# Patient Record
Sex: Male | Born: 2000
Health system: Southern US, Community
[De-identification: ages and names within clinical notes are randomized; demographics above are authoritative.]

---
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
## 2022-03-09 ENCOUNTER — Encounter: Payer: Self-pay | Admitting: Family Medicine

## 2022-03-09 ENCOUNTER — Ambulatory Visit: Payer: BC Managed Care – PPO | Admitting: Family Medicine

## 2022-03-09 VITALS — BP 130/80 | HR 80 | Temp 99.0°F | Ht 67.0 in | Wt 162.2 lb

## 2022-03-09 DIAGNOSIS — H6690 Otitis media, unspecified, unspecified ear: Secondary | ICD-10-CM

## 2022-03-09 MED ORDER — AMOXICILLIN 875 MG PO TABS: 875.0000 mg | ORAL_TABLET | Freq: Two times a day (BID) | ORAL | 0 refills | Status: DC

## 2022-03-09 NOTE — Patient Instructions (Signed)
  Take the antibiotic twice daily for 10 days to treat an ear infection. Allergies or a cold could be contributing to why your ears were hurting and got filled with fluid (that subsequently got infected).  You may take tylenol and/or ibuprofen if needed for fever or pain. Use Sudafed if needed for any ear discomfort, congestion and runny nose.  You may also take a claritin or allegra or zyrtec if you feel like you have seasonal allergies starting (itchy/watery eyes, sneezing, runny nose).  Return for recheck if you have persistent or worsening symptoms.  If you feel much worse tomorrow (high fever, body aches, etc), then you may want to do a home COVID test.

## 2022-03-09 NOTE — Progress Notes (Signed)
Chief Complaint  Patient presents with   Ear Pain    Ear pain x 1 week. Started with L ear about a week ago, R ear began bothering him Sunday or Monday this week.    Started with L ear pain 1-2 weeks ago, and is now in both ears. R started hurting Sun/Mon of this week. Feeling worse overall today.  He has pain on both sides when he opens his mouth, hurts to eat. No clicking/locking/popping. Both ears seem muffled, decreased hearing. Sometimes the ears will pop, not today.  Pain no different after popping.  He reports some PND, runny nose.  No OTC meds for his symptoms.   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   No facility-administered encounter medications on file as of 03/09/2022.   No Known Allergies  ROS: No known fever, slight chills. No n/v/d. No sinus pain.   Denies cough, shortness of breath or chest pain. Ear and jaw pain per HPI   PHYSICAL EXAM:  BP 130/80   Pulse 80   Temp 99 F (37.2 C) (Tympanic)   Ht 5\' 7"  (1.702 m)   Wt 162 lb 3.2 oz (73.6 kg)   BMI 25.40 kg/m   Well-appearing male in no distress HEENT: conjunctiva and sclera are clear, EOMI. TM's--effusions bilaterally, some bulging on the left. Tender to palpation to anterior ear, at TMJ, and also mildly tender at L mastoid. Nasal mucosa is moderately edematous with clear mucus on the left, mild on the R. Sinuses are nontender. OP is clear Neck: no lymphadenopathy, thyromegaly or mass Heart: regular rate and rhythm, no murmur Lungs: clear bilaterally Neuro: alert and oriented, cranial nerves intact, normal gait.  ASSESSMENT/PLAN:  Acute otitis media, unspecified otitis media type - L, poss early on the R. Suspect bacterial at this point. May have started out as ETD.Tender at TMJ's, poss MSK component to pain. ABX, NSAIDs - Plan:  amoxicillin (AMOXIL) 875 MG tablet   Take the antibiotic twice daily for 10 days to treat an ear infection. Allergies or a cold could be contributing to why your ears were hurting and got filled with fluid (that subsequently got infected).  You may take tylenol and/or ibuprofen if needed for fever or pain. Use Sudafed if needed for any ear discomfort, congestion and runny nose.  You may also take a claritin or allegra or zyrtec if you feel like you have seasonal allergies starting (itchy/watery eyes, sneezing, runny nose).

## 2022-03-16 ENCOUNTER — Encounter: Payer: Self-pay | Admitting: Internal Medicine

## 2022-04-19 ENCOUNTER — Encounter: Payer: Self-pay | Admitting: Internal Medicine

## 2022-05-24 ENCOUNTER — Encounter: Payer: Self-pay | Admitting: Internal Medicine

## 2022-08-02 DIAGNOSIS — F411 Generalized anxiety disorder: Secondary | ICD-10-CM | POA: Diagnosis not present

## 2022-08-02 DIAGNOSIS — F331 Major depressive disorder, recurrent, moderate: Secondary | ICD-10-CM | POA: Diagnosis not present

## 2022-08-20 DIAGNOSIS — F411 Generalized anxiety disorder: Secondary | ICD-10-CM | POA: Diagnosis not present

## 2022-08-20 DIAGNOSIS — F331 Major depressive disorder, recurrent, moderate: Secondary | ICD-10-CM | POA: Diagnosis not present

## 2022-09-15 DIAGNOSIS — F331 Major depressive disorder, recurrent, moderate: Secondary | ICD-10-CM | POA: Diagnosis not present

## 2022-09-15 DIAGNOSIS — F411 Generalized anxiety disorder: Secondary | ICD-10-CM | POA: Diagnosis not present

## 2022-10-17 DIAGNOSIS — F4312 Post-traumatic stress disorder, chronic: Secondary | ICD-10-CM | POA: Diagnosis not present

## 2022-10-17 DIAGNOSIS — F331 Major depressive disorder, recurrent, moderate: Secondary | ICD-10-CM | POA: Diagnosis not present

## 2022-10-17 DIAGNOSIS — F411 Generalized anxiety disorder: Secondary | ICD-10-CM | POA: Diagnosis not present

## 2022-11-22 ENCOUNTER — Encounter: Payer: Self-pay | Admitting: Nurse Practitioner

## 2022-11-22 ENCOUNTER — Ambulatory Visit (INDEPENDENT_AMBULATORY_CARE_PROVIDER_SITE_OTHER): Payer: BC Managed Care – PPO | Admitting: Nurse Practitioner

## 2022-11-22 VITALS — BP 124/82 | HR 64 | Ht 68.75 in | Wt 161.2 lb

## 2022-11-22 DIAGNOSIS — H5213 Myopia, bilateral: Secondary | ICD-10-CM

## 2022-11-22 DIAGNOSIS — R7303 Prediabetes: Secondary | ICD-10-CM

## 2022-11-22 DIAGNOSIS — Z0000 Encounter for general adult medical examination without abnormal findings: Secondary | ICD-10-CM | POA: Diagnosis not present

## 2022-11-22 DIAGNOSIS — E559 Vitamin D deficiency, unspecified: Secondary | ICD-10-CM | POA: Diagnosis not present

## 2022-11-22 DIAGNOSIS — J309 Allergic rhinitis, unspecified: Secondary | ICD-10-CM

## 2022-11-22 DIAGNOSIS — L309 Dermatitis, unspecified: Secondary | ICD-10-CM

## 2022-11-22 DIAGNOSIS — H5210 Myopia, unspecified eye: Secondary | ICD-10-CM | POA: Insufficient documentation

## 2022-11-22 DIAGNOSIS — M2140 Flat foot [pes planus] (acquired), unspecified foot: Secondary | ICD-10-CM | POA: Insufficient documentation

## 2022-11-22 DIAGNOSIS — F900 Attention-deficit hyperactivity disorder, predominantly inattentive type: Secondary | ICD-10-CM | POA: Diagnosis not present

## 2022-11-22 DIAGNOSIS — R010 Benign and innocent cardiac murmurs: Secondary | ICD-10-CM | POA: Insufficient documentation

## 2022-11-22 DIAGNOSIS — L83 Acanthosis nigricans: Secondary | ICD-10-CM | POA: Insufficient documentation

## 2022-11-22 NOTE — Assessment & Plan Note (Signed)
Patient wears contacts. No concerns

## 2022-11-22 NOTE — Progress Notes (Signed)
BP 124/82   Pulse 64   Ht 5' 8.75" (1.746 m)   Wt 161 lb 3.2 oz (73.1 kg)   BMI 23.98 kg/m    Subjective:    Patient ID: Michael Navarro, male    DOB: Nov 10, 2000, 22 y.o.   MRN: [id number]  HPI: Michael Navarro is a 22 y.o. male presenting on 11/22/2022 for comprehensive medical examination.    Current medical concerns include: Finishing his sophomore year at [employer]. He is playing football. He would like to play pro football, but if that is not an option then he is interested in veterinary medicine or entrepreneurship. He is tells me he is hoping to get his #7 [place] back this year, which he has had up until he started college.  A comprehensive review of systems was negative.  IMMUNIZATIONS:   Flu: Flu vaccine postponed until flu season Prevnar 13: Prevnar 13 N/A for this patient Pneumovax 23: Pneumovax 23 N/A for this patient Prevnar 20: Prevnar 20 N/A for this patient HPV: HPV N/A for this patient Vac Shingrix: Shingrix N/A for this patient Tetanus: Tetanus completed in the last 10 years Covid-19 vaccine status: Completed vaccines  HEALTH MAINTENANCE: Colon Cancer Screening HM Status: is not applicable for this patient STI Testing HM Status: was declined  PSA Screen HM Status: is not applicable for this patient Lung Ca Screen HM Status: is not applicable for this patient AAA Screen HM Status: is not applicable for this patient  He reports regular vision exams q1-5y: Yes  He reports regular dental exams q 32m:  Yes  The patient eats a regular, healthy diet. He endorses exercise and/or activity of: Active 30 + min 4+ x/wk Mode: cardio, weights   Most Recent Depression Screen:     11/22/2022    9:27 AM 11/19/2021    2:25 PM  Depression screen PHQ 2/9  Decreased Interest 0 0  Down, Depressed, Hopeless 0 0  PHQ - 2 Score 0 0   Most Recent Anxiety Screen:     No data to display         Most Recent Falls Screen:    11/22/2022    9:27 AM  03/09/2022    1:30 PM  Fall Risk   Falls in the past year? 0 0  Number falls in past yr: 0 0  Injury with Fall? 0 0  Risk for fall due to : No Fall Risks No Fall Risks  Follow up Falls evaluation completed Falls evaluation completed    Past medical history, surgical history, medications, allergies, family history and social history reviewed with patient today and changes made to appropriate areas of the chart.  Past Medical History:  Past Medical History:  Diagnosis Date   Functional cardiac murmur 11/22/2022   Medications:  No current outpatient medications on file prior to visit.   No current facility-administered medications on file prior to visit.   Surgical History:  No past surgical history on file. Allergies:  No Known Allergies Social History:  Social History   Socioeconomic History   Marital status: Single    Spouse name: Not on file   Number of children: Not on file   Years of education: Not on file   Highest education level: Not on file  Occupational History   Not on file  Tobacco Use   Smoking status: Never   Smokeless tobacco: Never  Substance and Sexual Activity   Alcohol use: Never   Drug use: Never  Sexual activity: Not on file  Other Topics Concern   Not on file  Social History Narrative   Student at [employer], sophomore.   Plays football.   Social Determinants of Health   Financial Resource Strain: Not on file  Food Insecurity: Not on file  Transportation Needs: Not on file  Physical Activity: Not on file  Stress: Not on file  Social Connections: Not on file  Intimate Partner Violence: Not on file   Social History   Tobacco Use  Smoking Status Never  Smokeless Tobacco Never   Social History   Substance and Sexual Activity  Alcohol Use Never   Family History:  No family history on file.     Objective:    BP 124/82   Pulse 64   Ht 5' 8.75" (1.746 m)   Wt 161 lb 3.2 oz (73.1 kg)   BMI 23.98 kg/m   Wt Readings from  Last 3 Encounters:  11/22/22 161 lb 3.2 oz (73.1 kg)  03/09/22 162 lb 3.2 oz (73.6 kg) (63 %, Z= 0.33)*  11/19/21 153 lb 12.8 oz (69.8 kg) (52 %, Z= 0.05)*   * Growth percentiles are based on CDC (Boys, 2-20 Years) data.    Physical Exam Vitals and nursing note reviewed.  Constitutional:      Appearance: Normal appearance.  HENT:     Head: Normocephalic.     Right Ear: Tympanic membrane, ear canal and external ear normal.     Left Ear: Tympanic membrane, ear canal and external ear normal.     Nose: Nose normal.     Mouth/Throat:     Mouth: Mucous membranes are moist.     Pharynx: Oropharynx is clear.  Eyes:     Pupils: Pupils are equal, round, and reactive to light.  Neck:     Vascular: No carotid bruit.  Cardiovascular:     Rate and Rhythm: Normal rate and regular rhythm.     Pulses: Normal pulses.     Heart sounds: Normal heart sounds. No murmur heard. Pulmonary:     Effort: Pulmonary effort is normal.     Breath sounds: Normal breath sounds. No wheezing or rhonchi.  Chest:     Chest wall: No tenderness.  Abdominal:     General: Abdomen is flat. Bowel sounds are normal. There is no distension.     Palpations: Abdomen is soft.     Tenderness: There is no abdominal tenderness. There is no right CVA tenderness, left CVA tenderness or guarding.  Musculoskeletal:        General: No tenderness. Normal range of motion.     Cervical back: Normal range of motion and neck supple. No tenderness.     Right lower leg: No edema.     Left lower leg: No edema.  Lymphadenopathy:     Cervical: No cervical adenopathy.  Skin:    General: Skin is warm and dry.     Capillary Refill: Capillary refill takes less than 2 seconds.  Neurological:     General: No focal deficit present.     Mental Status: He is alert and oriented to person, place, and time.     Sensory: No sensory deficit.     Motor: No weakness.     Coordination: Coordination normal.  Psychiatric:        Mood and Affect:  Mood normal.     Results for orders placed or performed in visit on 11/19/21  Sickle Cell Panel  Result Value Ref  Range   Glucose 84 70 - 99 mg/dL   BUN 10 6 - 20 mg/dL   Creatinine, Ser 1.61 0.76 - 1.27 mg/dL   eGFR 096 >04 VW/UJW/1.19   BUN/Creatinine Ratio 9 9 - 20   Sodium 140 134 - 144 mmol/L   Potassium 4.3 3.5 - 5.2 mmol/L   Chloride 102 96 - 106 mmol/L   CO2 26 20 - 29 mmol/L   Calcium 9.7 8.7 - 10.2 mg/dL   Total Protein 7.3 6.0 - 8.5 g/dL   Albumin 4.7 4.1 - 5.2 g/dL   Globulin, Total 2.6 1.5 - 4.5 g/dL   Albumin/Globulin Ratio 1.8 1.2 - 2.2   Bilirubin Total 0.4 0.0 - 1.2 mg/dL   Alkaline Phosphatase 72 51 - 125 IU/L   AST 16 0 - 40 IU/L   ALT 31 0 - 44 IU/L   Ferritin 55 16 - 124 ng/mL   Vit D, 25-Hydroxy 21.4 (L) 30.0 - 100.0 ng/mL   WBC 4.6 3.4 - 10.8 x10E3/uL   RBC 5.30 4.14 - 5.80 x10E6/uL   Hemoglobin 13.6 13.0 - 17.7 g/dL   Hematocrit 14.7 82.9 - 51.0 %   MCV 79 79 - 97 fL   MCH 25.7 (L) 26.6 - 33.0 pg   MCHC 32.5 31.5 - 35.7 g/dL   RDW 56.2 13.0 - 86.5 %   Platelets 300 150 - 450 x10E3/uL   Neutrophils 47 Not Estab. %   Lymphs 37 Not Estab. %   Monocytes 10 Not Estab. %   Eos 5 Not Estab. %   Basos 1 Not Estab. %   Neutrophils Absolute 2.1 1.4 - 7.0 x10E3/uL   Lymphocytes Absolute 1.7 0.7 - 3.1 x10E3/uL   Monocytes Absolute 0.5 0.1 - 0.9 x10E3/uL   EOS (ABSOLUTE) 0.2 0.0 - 0.4 x10E3/uL   Basophils Absolute 0.1 0.0 - 0.2 x10E3/uL   Immature Granulocytes 0 Not Estab. %   Immature Grans (Abs) 0.0 0.0 - 0.1 x10E3/uL   Retic Ct Pct 0.9 0.6 - 2.6 %  Sickle cell screen  Result Value Ref Range   Sickle Cell Screen Negative Negative  Specimen status report  Result Value Ref Range   specimen status report Comment       Assessment & Plan:   Problem List Items Addressed This Visit     Allergic rhinitis    Michael tells me he is doing well with allergies at this time. He is not taking any medications at this time and is not having any current  symptoms. Continue to monitor.       Relevant Orders   Hemoglobin A1c   CBC with Differential/Platelet   Comprehensive metabolic panel   VITAMIN D 25 Hydroxy (Vit-D Deficiency, Fractures)   TSH   Eczema    No concerns present at this time. No evidence of rash or erythema visible. Patient instructed to follow-up if he has any symptoms.       Relevant Orders   Hemoglobin A1c   CBC with Differential/Platelet   Comprehensive metabolic panel   VITAMIN D 25 Hydroxy (Vit-D Deficiency, Fractures)   TSH   Attention deficit hyperactivity disorder, predominantly inattentive type    History of ADHD with medication management in the past. He is currently not taking any medication and is doing well. He denies any concerns or need to restart medications at this time. We discussed if he feels that his attention and focus are worsening to let me know and we can restart medications.  Relevant Orders   Hemoglobin A1c   CBC with Differential/Platelet   Comprehensive metabolic panel   VITAMIN D 25 Hydroxy (Vit-D Deficiency, Fractures)   TSH   Myopia    Patient wears contacts. No concerns      Relevant Orders   Hemoglobin A1c   CBC with Differential/Platelet   Comprehensive metabolic panel   VITAMIN D 25 Hydroxy (Vit-D Deficiency, Fractures)   TSH   Prediabetes    Historic diagnosis. We will obtain labs today for evaluation and monitoring.       Relevant Orders   Hemoglobin A1c   CBC with Differential/Platelet   Comprehensive metabolic panel   VITAMIN D 25 Hydroxy (Vit-D Deficiency, Fractures)   TSH   Vitamin D deficiency    Labs today for re-evaluation.      Relevant Orders   Hemoglobin A1c   CBC with Differential/Platelet   Comprehensive metabolic panel   VITAMIN D 25 Hydroxy (Vit-D Deficiency, Fractures)   TSH   Encounter for annual physical exam - Primary    CPE today with no abnormalities noted on exam.  Labs pending. Will make changes as necessary based on results.   Review of HM activities and recommendations discussed and provided on AVS Anticipatory guidance, diet, and exercise recommendations provided.  Medications, allergies, and hx reviewed and updated as necessary.  Plan to f/u with CPE in 1 year or sooner for acute/chronic health needs as directed.        Relevant Orders   Hemoglobin A1c   CBC with Differential/Platelet   Comprehensive metabolic panel   VITAMIN D 25 Hydroxy (Vit-D Deficiency, Fractures)   TSH     Follow up plan: NEXT PREVENTATIVE PHYSICAL DUE IN 1 YEAR. Return in about 1 year (around 11/22/2023).  LABORATORY TESTING:  Health maintenance labs ordered today, if applicable.    PATIENT COUNSELING:   For all adult patients, I recommend A well balanced diet low in saturated fats, cholesterol, and moderation in carbohydrates.  This can be as simple as monitoring portion sizes and cutting back on sugary beverages such as soda and juice to start with.    Daily water consumption of at least 64 ounces.  Physical activity at least 180 minutes per week, if just starting out.  This can be as simple as taking the stairs instead of the elevator and walking 2-3 laps around the office  purposefully every day.   STD protection, partner selection, and regular testing if high risk.  Limited consumption of alcoholic beverages if alcohol is consumed. For men, I recommend no more than 14 alcoholic beverages per week, spread out throughout the week (max 2 per day). Avoid "binge" drinking or consuming large quantities of alcohol in one setting.  Please let me know if you feel you may need help with reduction or quitting alcohol consumption.   Avoidance of nicotine, if used. Please let me know if you feel you may need help with reduction or quitting nicotine use.   Daily mental health attention. This can be in the form of 5 minute daily meditation, prayer, journaling, yoga, reflection, etc.  Purposeful attention to your emotions and  mental state can significantly improve your overall wellbeing  and  Health.  Please know that I am here to help you with all of your health care goals and am happy to work with you to find a solution that works best for you.  The greatest advice I have received with any changes in life are to take it  one step at a time, that even means if all you can focus on is the next 60 seconds, then do that and celebrate your victories.  With any changes in life, you will have set backs, and that is OK. The important thing to remember is, if you have a set back, it is not a failure, it is an opportunity to try again!  Health Maintenance Recommendations Screening Testing Mammogram Every 1 -2 years based on history and risk factors Starting at age 37 Pap Smear Ages 21-39 every 3 years Ages 63-65 every 5 years with HPV testing More frequent testing may be required based on results and history Colon Cancer Screening Every 1-10 years based on test performed, risk factors, and history Starting at age 22 Bone Density Screening Every 2-10 years based on history Starting at age 38 for women Recommendations for men differ based on medication usage, history, and risk factors AAA Screening One time ultrasound Men 93-14 years old who have every smoked Lung Cancer Screening Low Dose Lung CT every 12 months Age 15-80 years with a 30 pack-year smoking history who still smoke or who have quit within the last 15 years   Screening Labs Routine  Labs: Complete Blood Count (CBC), Complete Metabolic Panel (CMP), Cholesterol (Lipid Panel) Every 6-12 months based on history and medications May be recommended more frequently based on current conditions or previous results Hemoglobin A1c Lab Every 3-12 months based on history and previous results Starting at age 73 or earlier with diagnosis of diabetes, high cholesterol, BMI >26, and/or risk factors Frequent monitoring for patients with diabetes to ensure blood sugar  control Thyroid Panel (TSH) Every 6 months based on history, symptoms, and risk factors May be repeated more often if on medication HIV One time testing for all patients 28 and older May be repeated more frequently for patients with increased risk factors or exposure Hepatitis C One time testing for all patients 88 and older May be repeated more frequently for patients with increased risk factors or exposure Gonorrhea, Chlamydia Every 12 months for all sexually active persons 13-24 years Additional monitoring may be recommended for those who are considered high risk or who have symptoms Every 12 months for any woman on birth control, regardless of sexual activity PSA Men 60-29 years old with risk factors Additional screening may be recommended from age 52-69 based on risk factors, symptoms, and history  Vaccine Recommendations Tetanus Booster All adults every 10 years Flu Vaccine All patients 6 months and older every year COVID Vaccine All patients 12 years and older Initial dosing with booster May recommend additional booster based on age and health history HPV Vaccine 2 doses all patients age 64-26 Dosing may be considered for patients over 26 Shingles Vaccine (Shingrix) 2 doses all adults 55 years and older Pneumonia (Pneumovax 44) All adults 65 years and older May recommend earlier dosing based on health history One year apart from Prevnar 26 Pneumonia (Prevnar 25) All adults 65 years and older Dosed 1 year after Pneumovax 23 Pneumonia (Prevnar 20) One time alternative to the two dosing of 13 and 23 For all adults with initial dose of 23, 20 is recommended 1 year later For all adults with initial dose of 13, 23 is still recommended as second option 1 year later  Additional Screening, Testing, and Vaccinations may be recommended on an individualized basis based on family history, health history, risk factors, and/or exposure.

## 2022-11-22 NOTE — Assessment & Plan Note (Signed)
Michael Navarro tells me he is doing well with allergies at this time. He is not taking any medications at this time and is not having any current symptoms. Continue to monitor.

## 2022-11-22 NOTE — Patient Instructions (Signed)
You are looking great today!! I hope you have a wonderful summer.   When your labs come back I will review these and send you a message if there are any concerns.  If you have any questions or needs, please don't hesitate to reach out to me.      For all adult patients, I recommend A well balanced diet low in saturated fats, cholesterol, and moderation in carbohydrates.   This can be as simple as monitoring portion sizes and cutting back on sugary beverages such as soda and juice to start with.    Daily water consumption of at least 64 ounces.  Physical activity at least 180 minutes per week, if just starting out.   This can be as simple as taking the stairs instead of the elevator and walking 2-3 laps around the office  purposefully every day.   STD protection, partner selection, and regular testing if high risk.  Limited consumption of alcoholic beverages if alcohol is consumed.  For women, I recommend no more than 7 alcoholic beverages per week, spread out throughout the week.  Avoid "binge" drinking or consuming large quantities of alcohol in one setting.   Please let me know if you feel you may need help with reduction or quitting alcohol consumption.   Avoidance of nicotine, if used.  Please let me know if you feel you may need help with reduction or quitting nicotine use.   Daily mental health attention.  This can be in the form of 5 minute daily meditation, prayer, journaling, yoga, reflection, etc.   Purposeful attention to your emotions and mental state can significantly improve your overall wellbeing  and  Health.  Please know that I am here to help you with all of your health care goals and am happy to work with you to find a solution that works best for you.  The greatest advice I have received with any changes in life are to take it one step at a time, that even means if all you can focus on is the next 60 seconds, then do that and celebrate your victories.  With any  changes in life, you will have set backs, and that is OK. The important thing to remember is, if you have a set back, it is not a failure, it is an opportunity to try again!  Health Maintenance Recommendations Screening Testing Mammogram Every 1 -2 years based on history and risk factors Starting at age 76 Pap Smear Ages 21-39 every 3 years Ages 3-65 every 5 years with HPV testing More frequent testing may be required based on results and history Colon Cancer Screening Every 1-10 years based on test performed, risk factors, and history Starting at age 87 Bone Density Screening Every 2-10 years based on history Starting at age 81 for women Recommendations for men differ based on medication usage, history, and risk factors AAA Screening One time ultrasound Men 49-110 years old who have every smoked Lung Cancer Screening Low Dose Lung CT every 12 months Age 21-80 years with a 30 pack-year smoking history who still smoke or who have quit within the last 15 years  Screening Labs Routine  Labs: Complete Blood Count (CBC), Complete Metabolic Panel (CMP), Cholesterol (Lipid Panel) Every 6-12 months based on history and medications May be recommended more frequently based on current conditions or previous results Hemoglobin A1c Lab Every 3-12 months based on history and previous results Starting at age 16 or earlier with diagnosis of diabetes, high cholesterol,  BMI >26, and/or risk factors Frequent monitoring for patients with diabetes to ensure blood sugar control Thyroid Panel (TSH w/ T3 & T4) Every 6 months based on history, symptoms, and risk factors May be repeated more often if on medication HIV One time testing for all patients 77 and older May be repeated more frequently for patients with increased risk factors or exposure Hepatitis C One time testing for all patients 61 and older May be repeated more frequently for patients with increased risk factors or exposure Gonorrhea,  Chlamydia Every 12 months for all sexually active persons 13-24 years Additional monitoring may be recommended for those who are considered high risk or who have symptoms PSA Men 83-7 years old with risk factors Additional screening may be recommended from age 57-69 based on risk factors, symptoms, and history  Vaccine Recommendations Tetanus Booster All adults every 10 years Flu Vaccine All patients 6 months and older every year COVID Vaccine All patients 12 years and older Initial dosing with booster May recommend additional booster based on age and health history HPV Vaccine 2 doses all patients age 64-26 Dosing may be considered for patients over 26 Shingles Vaccine (Shingrix) 2 doses all adults 55 years and older Pneumonia (Pneumovax 23) All adults 65 years and older May recommend earlier dosing based on health history Pneumonia (Prevnar 10) All adults 65 years and older Dosed 1 year after Pneumovax 23  Additional Screening, Testing, and Vaccinations may be recommended on an individualized basis based on family history, health history, risk factors, and/or exposure.

## 2022-11-22 NOTE — Assessment & Plan Note (Signed)

## 2022-11-22 NOTE — Assessment & Plan Note (Signed)
No concerns present at this time. No evidence of rash or erythema visible. Patient instructed to follow-up if he has any symptoms.

## 2022-11-22 NOTE — Assessment & Plan Note (Signed)
Labs today for re-evaluation.

## 2022-11-22 NOTE — Assessment & Plan Note (Signed)
History of ADHD with medication management in the past. He is currently not taking any medication and is doing well. He denies any concerns or need to restart medications at this time. We discussed if he feels that his attention and focus are worsening to let me know and we can restart medications.

## 2022-11-22 NOTE — Assessment & Plan Note (Signed)
Historic diagnosis. We will obtain labs today for evaluation and monitoring.

## 2022-11-23 LAB — COMPREHENSIVE METABOLIC PANEL
ALT: 41 IU/L (ref 0–44)
AST: 14 IU/L (ref 0–40)
Albumin/Globulin Ratio: 1.8 (ref 1.2–2.2)
Albumin: 4.6 g/dL (ref 4.3–5.2)
Alkaline Phosphatase: 66 IU/L (ref 51–125)
BUN/Creatinine Ratio: 10 (ref 9–20)
BUN: 11 mg/dL (ref 6–20)
Bilirubin Total: 0.4 mg/dL (ref 0.0–1.2)
CO2: 23 mmol/L (ref 20–29)
Calcium: 9.8 mg/dL (ref 8.7–10.2)
Chloride: 100 mmol/L (ref 96–106)
Creatinine, Ser: 1.08 mg/dL (ref 0.76–1.27)
Globulin, Total: 2.5 g/dL (ref 1.5–4.5)
Glucose: 102 mg/dL — ABNORMAL HIGH (ref 70–99)
Potassium: 4.3 mmol/L (ref 3.5–5.2)
Sodium: 140 mmol/L (ref 134–144)
Total Protein: 7.1 g/dL (ref 6.0–8.5)
eGFR: 101 mL/min/{1.73_m2} (ref >59)

## 2022-11-23 LAB — HEMOGLOBIN A1C
Est. average glucose Bld gHb Est-mCnc: 117 mg/dL
Hgb A1c MFr Bld: 5.7 % — ABNORMAL HIGH (ref 4.8–5.6)

## 2022-11-23 LAB — CBC WITH DIFFERENTIAL/PLATELET
Basophils Absolute: 0.1 10*3/uL (ref 0.0–0.2)
Basos: 1 %
EOS (ABSOLUTE): 0.3 10*3/uL (ref 0.0–0.4)
Eos: 6 %
Hematocrit: 41.8 % (ref 37.5–51.0)
Hemoglobin: 13.5 g/dL (ref 13.0–17.7)
Immature Grans (Abs): 0 10*3/uL (ref 0.0–0.1)
Immature Granulocytes: 0 %
Lymphocytes Absolute: 1.6 10*3/uL (ref 0.7–3.1)
Lymphs: 37 %
MCH: 25.5 pg — ABNORMAL LOW (ref 26.6–33.0)
MCHC: 32.3 g/dL (ref 31.5–35.7)
MCV: 79 fL (ref 79–97)
Monocytes Absolute: 0.3 10*3/uL (ref 0.1–0.9)
Monocytes: 8 %
Neutrophils Absolute: 2 10*3/uL (ref 1.4–7.0)
Neutrophils: 48 %
Platelets: 254 10*3/uL (ref 150–450)
RBC: 5.3 x10E6/uL (ref 4.14–5.80)
RDW: 12.3 % (ref 11.6–15.4)
WBC: 4.2 10*3/uL (ref 3.4–10.8)

## 2022-11-23 LAB — TSH: TSH: 1.03 u[IU]/mL (ref 0.450–4.500)

## 2022-11-23 LAB — VITAMIN D 25 HYDROXY (VIT D DEFICIENCY, FRACTURES): Vit D, 25-Hydroxy: 47.2 ng/mL (ref 30.0–100.0)

## 2023-01-28 ENCOUNTER — Ambulatory Visit (HOSPITAL_COMMUNITY)
Admission: EM | Admit: 2023-01-28 | Discharge: 2023-01-28 | Disposition: A | Discharge status: Home / Self Care | Payer: BC Managed Care – PPO | Source: Home / Self Care

## 2023-01-28 ENCOUNTER — Ambulatory Visit (INDEPENDENT_AMBULATORY_CARE_PROVIDER_SITE_OTHER): Payer: BC Managed Care – PPO

## 2023-01-28 ENCOUNTER — Encounter (HOSPITAL_COMMUNITY): Payer: Self-pay

## 2023-01-28 DIAGNOSIS — R051 Acute cough: Secondary | ICD-10-CM | POA: Insufficient documentation

## 2023-01-28 DIAGNOSIS — J029 Acute pharyngitis, unspecified: Secondary | ICD-10-CM

## 2023-01-28 DIAGNOSIS — R059 Cough, unspecified: Secondary | ICD-10-CM | POA: Diagnosis not present

## 2023-01-28 LAB — POCT RAPID STREP A (OFFICE): Rapid Strep A Screen: NEGATIVE

## 2023-01-28 MED ORDER — IBUPROFEN 800 MG PO TABS: 800.0000 mg | ORAL_TABLET | Freq: Three times a day (TID) | ORAL | 0 refills | Status: DC

## 2023-01-28 MED ORDER — BENZONATATE 100 MG PO CAPS: 100.0000 mg | ORAL_CAPSULE | Freq: Three times a day (TID) | ORAL | 0 refills | Status: DC

## 2023-01-28 MED ORDER — ONDANSETRON 4 MG PO TBDP: 4.0000 mg | ORAL_TABLET | Freq: Three times a day (TID) | ORAL | 0 refills | Status: DC | PRN

## 2023-01-28 MED ORDER — ONDANSETRON 4 MG PO TBDP
4.0000 mg | ORAL_TABLET | Freq: Once | ORAL | Status: DC
Start: 2023-01-28 — End: 2023-01-28

## 2023-01-28 MED ORDER — IBUPROFEN 800 MG PO TABS
800.0000 mg | ORAL_TABLET | Freq: Once | ORAL | Status: DC
Start: 2023-01-28 — End: 2023-01-28

## 2023-01-28 NOTE — ED Provider Notes (Signed)
[facility]    CSN: [id number] Arrival date & time: 01/28/23  1308      History   Chief Complaint Chief Complaint  Patient presents with   Sore Throat    HPI Michael Navarro is a 22 y.o. male.   Patient presents to urgent care for evaluation of sore throat, nausea, dry cough, and fever/chills that started 3 days ago.  Sore throat is worsened by swallowing.  Cough is dry, nonproductive, and intermittent.  Sore throat is currently an 8 on a scale of 0-10 and the pain increases to a 9 out of 10 at nighttime.  Unsure of max temp at home.  Denies nasal congestion, vomiting, abdominal pain, rash, diarrhea, headache, dizziness, shortness of breath, chest pain, and heart palpitations.  Using Zicam and other over-the-counter medications at home for symptoms without relief.   Sore Throat    Past Medical History:  Diagnosis Date   Functional cardiac murmur 11/22/2022    Patient Active Problem List   Diagnosis Date Noted   Acanthosis nigricans 11/22/2022   Allergic rhinitis 11/22/2022   Eczema 11/22/2022   Attention deficit hyperactivity disorder, predominantly inattentive type 11/22/2022   Myopia 11/22/2022   Pes planus 11/22/2022   Prediabetes 11/22/2022   Vitamin D deficiency 11/22/2022   Encounter for annual physical exam 11/22/2022   Body mass index (BMI) of 23.0 to 23.9 in adult 11/19/2021    History reviewed. No pertinent surgical history.     Home Medications    Prior to Admission medications   Medication Sig Start Date End Date Taking? Authorizing Provider  benzonatate (TESSALON) 100 MG capsule Take 1 capsule (100 mg total) by mouth every 8 (eight) hours. 01/28/23  Yes Carlisle Beers, FNP  ibuprofen (ADVIL) 800 MG tablet Take 1 tablet (800 mg total) by mouth 3 (three) times daily. 01/28/23  Yes Carlisle Beers, FNP  ondansetron (ZOFRAN-ODT) 4 MG disintegrating tablet Take 1 tablet (4 mg total) by mouth every 8 (eight) hours as needed  for nausea or vomiting. 01/28/23  Yes Nyimah Shadduck, Donavan Burnet, FNP    Family History History reviewed. No pertinent family history.  Social History Social History   Tobacco Use   Smoking status: Never   Smokeless tobacco: Never  Substance Use Topics   Alcohol use: Never   Drug use: Never     Allergies   Patient has no known allergies.   Review of Systems Review of Systems Per HPI  Physical Exam Triage Vital Signs ED Triage Vitals  Encounter Vitals Group     BP 01/28/23 1454 (!) 159/92     Systolic BP Percentile --      Diastolic BP Percentile --      Pulse Rate 01/28/23 1454 70     Resp 01/28/23 1454 16     Temp 01/28/23 1454 99 F (37.2 C)     Temp Source 01/28/23 1454 Oral     SpO2 01/28/23 1454 99 %     Weight 01/28/23 1454 165 lb (74.8 kg)     Height 01/28/23 1454 5\' 8"  (1.727 m)     Head Circumference --      Peak Flow --      Pain Score 01/28/23 1450 9     Pain Loc --      Pain Education --      Exclude from Growth Chart --    No data found.  Updated Vital Signs BP (!) 159/92 (BP Location: Right Arm)  Pulse 70   Temp 99 F (37.2 C) (Oral)   Resp 16   Ht 5\' 8"  (1.727 m)   Wt 165 lb (74.8 kg)   SpO2 99%   BMI 25.09 kg/m   Visual Acuity Right Eye Distance:   Left Eye Distance:   Bilateral Distance:    Right Eye Near:   Left Eye Near:    Bilateral Near:     Physical Exam Vitals and nursing note reviewed.  Constitutional:      Appearance: He is not ill-appearing or toxic-appearing.  HENT:     Head: Normocephalic and atraumatic.     Right Ear: Hearing, tympanic membrane, ear canal and external ear normal.     Left Ear: Hearing, tympanic membrane, ear canal and external ear normal.     Nose: Nose normal.     Mouth/Throat:     Lips: Pink.     Mouth: Mucous membranes are moist. No injury.     Tongue: No lesions. Tongue does not deviate from midline.     Palate: No mass and lesions.     Pharynx: Uvula midline. Pharyngeal swelling and  posterior oropharyngeal erythema present. No oropharyngeal exudate or uvula swelling.     Tonsils: Tonsillar exudate (White patchy exudate to bilateral tonsils) present. No tonsillar abscesses. 2+ on the right. 2+ on the left.     Comments: Maintaining secretions without difficulty.  No trismus. Eyes:     General: Lids are normal. Vision grossly intact. Gaze aligned appropriately.     Extraocular Movements: Extraocular movements intact.     Conjunctiva/sclera: Conjunctivae normal.  Cardiovascular:     Rate and Rhythm: Normal rate and regular rhythm.     Heart sounds: Normal heart sounds, S1 normal and S2 normal.  Pulmonary:     Effort: Pulmonary effort is normal. No respiratory distress.     Breath sounds: Normal breath sounds and air entry.  Musculoskeletal:     Cervical back: Neck supple.  Skin:    General: Skin is warm and dry.     Capillary Refill: Capillary refill takes less than 2 seconds.     Findings: No rash.  Neurological:     General: No focal deficit present.     Mental Status: He is alert and oriented to person, place, and time. Mental status is at baseline.     Cranial Nerves: No dysarthria or facial asymmetry.  Psychiatric:        Mood and Affect: Mood normal.        Speech: Speech normal.        Behavior: Behavior normal.        Thought Content: Thought content normal.        Judgment: Judgment normal.      UC Treatments / Results  Labs (all labs ordered are listed, but only abnormal results are displayed) Labs Reviewed  CULTURE, GROUP A STREP [facility])  POCT RAPID STREP A (OFFICE)    EKG   Radiology DG Chest 2 View  Result Date: 01/28/2023 CLINICAL DATA:  Cough. EXAM: CHEST - 2 VIEW COMPARISON:  Chest radiograph dated April 01, 2021 FINDINGS: The heart size and mediastinal contours are within normal limits. Both lungs are clear. The visualized skeletal structures are unremarkable. IMPRESSION: No active cardiopulmonary disease. Electronically Signed    By: Larose Hires D.O.   On: 01/28/2023 15:32    Procedures Procedures (including critical care time)  Medications Ordered in UC Medications  ibuprofen (ADVIL) tablet 800 mg (has no  administration in time range)  ondansetron (ZOFRAN-ODT) disintegrating tablet 4 mg (has no administration in time range)    Initial Impression / Assessment and Plan / UC Course  I have reviewed the triage vital signs and the nursing notes.  Pertinent labs & imaging results that were available during my care of the patient were reviewed by me and considered in my medical decision making (see chart for details).   1.  Viral pharyngitis, acute cough Evaluation suggests viral URI etiology. Will manage this with recommendations for OTC and prescription medications for symptomatic relief. Encouraged to push fluids to stay well hydrated.  Imaging: deferred based on stable cardiopulmonary exam/hemodynamically stable vital signs Group A strep testing is negative.  Throat culture is pending.  Will call and treat with antibiotics if necessary based on results of throat culture. Medications given in clinic: Ibuprofen 800 mg and Zofran 4 mg ODT. May repeat these medications at home as needed for symptoms.  Counseled patient on potential for adverse effects with medications prescribed/recommended today, strict ER and return-to-clinic precautions discussed, patient verbalized understanding.    Final Clinical Impressions(s) / UC Diagnoses   Final diagnoses:  Viral pharyngitis  Acute cough     Discharge Instructions      Your strep testing of your throat in the clinic is negative. Throat culture has been sent to the lab to further check for bacteria to the back of your throat, staff will call you if this is positive in the next 2-3 days.   Use the following medicines to help with symptoms: - Ibuprofen 800mg  every 8 hours as needed for throat pain and inflammation with food. - 1 tablespoon of honey in warm water  and/or salt water gargles may also help with symptoms.  - Tessalon perles every 8 hours as needed for cough.  If you develop any new or worsening symptoms, please return.  If your symptoms are severe, please go to the emergency room.  Follow-up with your primary care provider for further evaluation and management of your symptoms as well as ongoing wellness visits.  I hope you feel better!      ED Prescriptions     Medication Sig Dispense Auth. Provider   ibuprofen (ADVIL) 800 MG tablet Take 1 tablet (800 mg total) by mouth 3 (three) times daily. 21 tablet Reita May M, FNP   ondansetron (ZOFRAN-ODT) 4 MG disintegrating tablet Take 1 tablet (4 mg total) by mouth every 8 (eight) hours as needed for nausea or vomiting. 20 tablet Reita May M, FNP   benzonatate (TESSALON) 100 MG capsule Take 1 capsule (100 mg total) by mouth every 8 (eight) hours. 21 capsule Carlisle Beers, FNP      PDMP not reviewed this encounter.   Carlisle Beers, [place] 01/28/23 1550

## 2023-01-28 NOTE — ED Triage Notes (Signed)
Patient here today with c/o ST, PND, chills, fever, cough, and neck stiffness X 3 days. He has tried [employer] with no relief. No sick contacts. No recent travel.

## 2023-01-28 NOTE — Discharge Instructions (Signed)
Your strep testing of your throat in the clinic is negative. Throat culture has been sent to the lab to further check for bacteria to the back of your throat, staff will call you if this is positive in the next 2-3 days.   Use the following medicines to help with symptoms: - Ibuprofen 800mg  every 8 hours as needed for throat pain and inflammation with food. - 1 tablespoon of honey in warm water and/or salt water gargles may also help with symptoms.  - Tessalon perles every 8 hours as needed for cough.  If you develop any new or worsening symptoms, please return.  If your symptoms are severe, please go to the emergency room.  Follow-up with your primary care provider for further evaluation and management of your symptoms as well as ongoing wellness visits.  I hope you feel better!

## 2023-01-30 LAB — CULTURE, GROUP A STREP (THRC)

## 2023-01-31 LAB — CULTURE, GROUP A STREP (THRC)

## 2023-03-21 DIAGNOSIS — M25551 Pain in right hip: Secondary | ICD-10-CM | POA: Diagnosis not present

## 2023-05-15 IMAGING — CR DG CHEST 2V
2 series · 2 of 2 positions shown · non-contrast
Comparison: None.

CLINICAL DATA: COVID positive 2 weeks ago with persistent cough and
chest pain.

EXAM:
CHEST - 2 VIEW

[w chest pa]
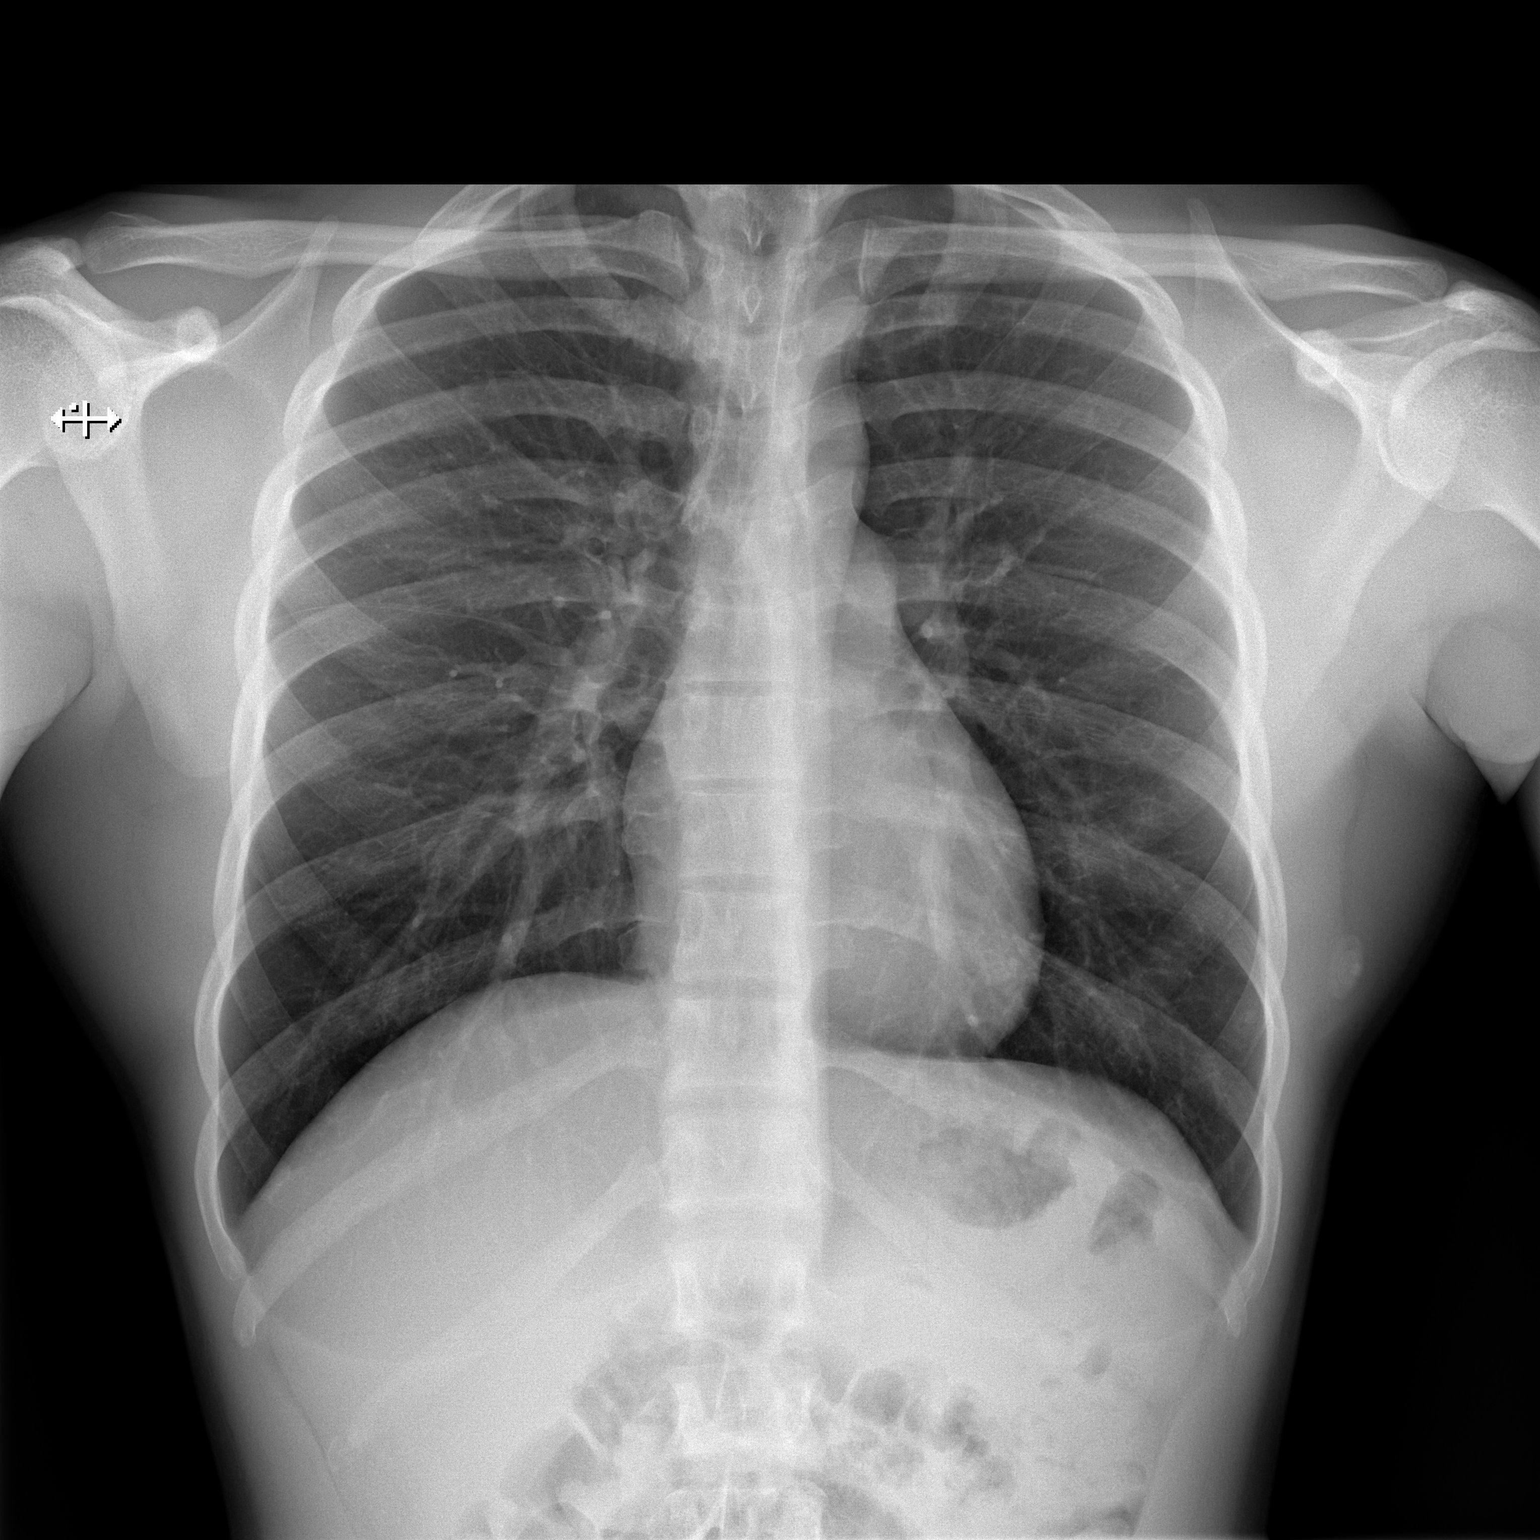

[w chest lat]
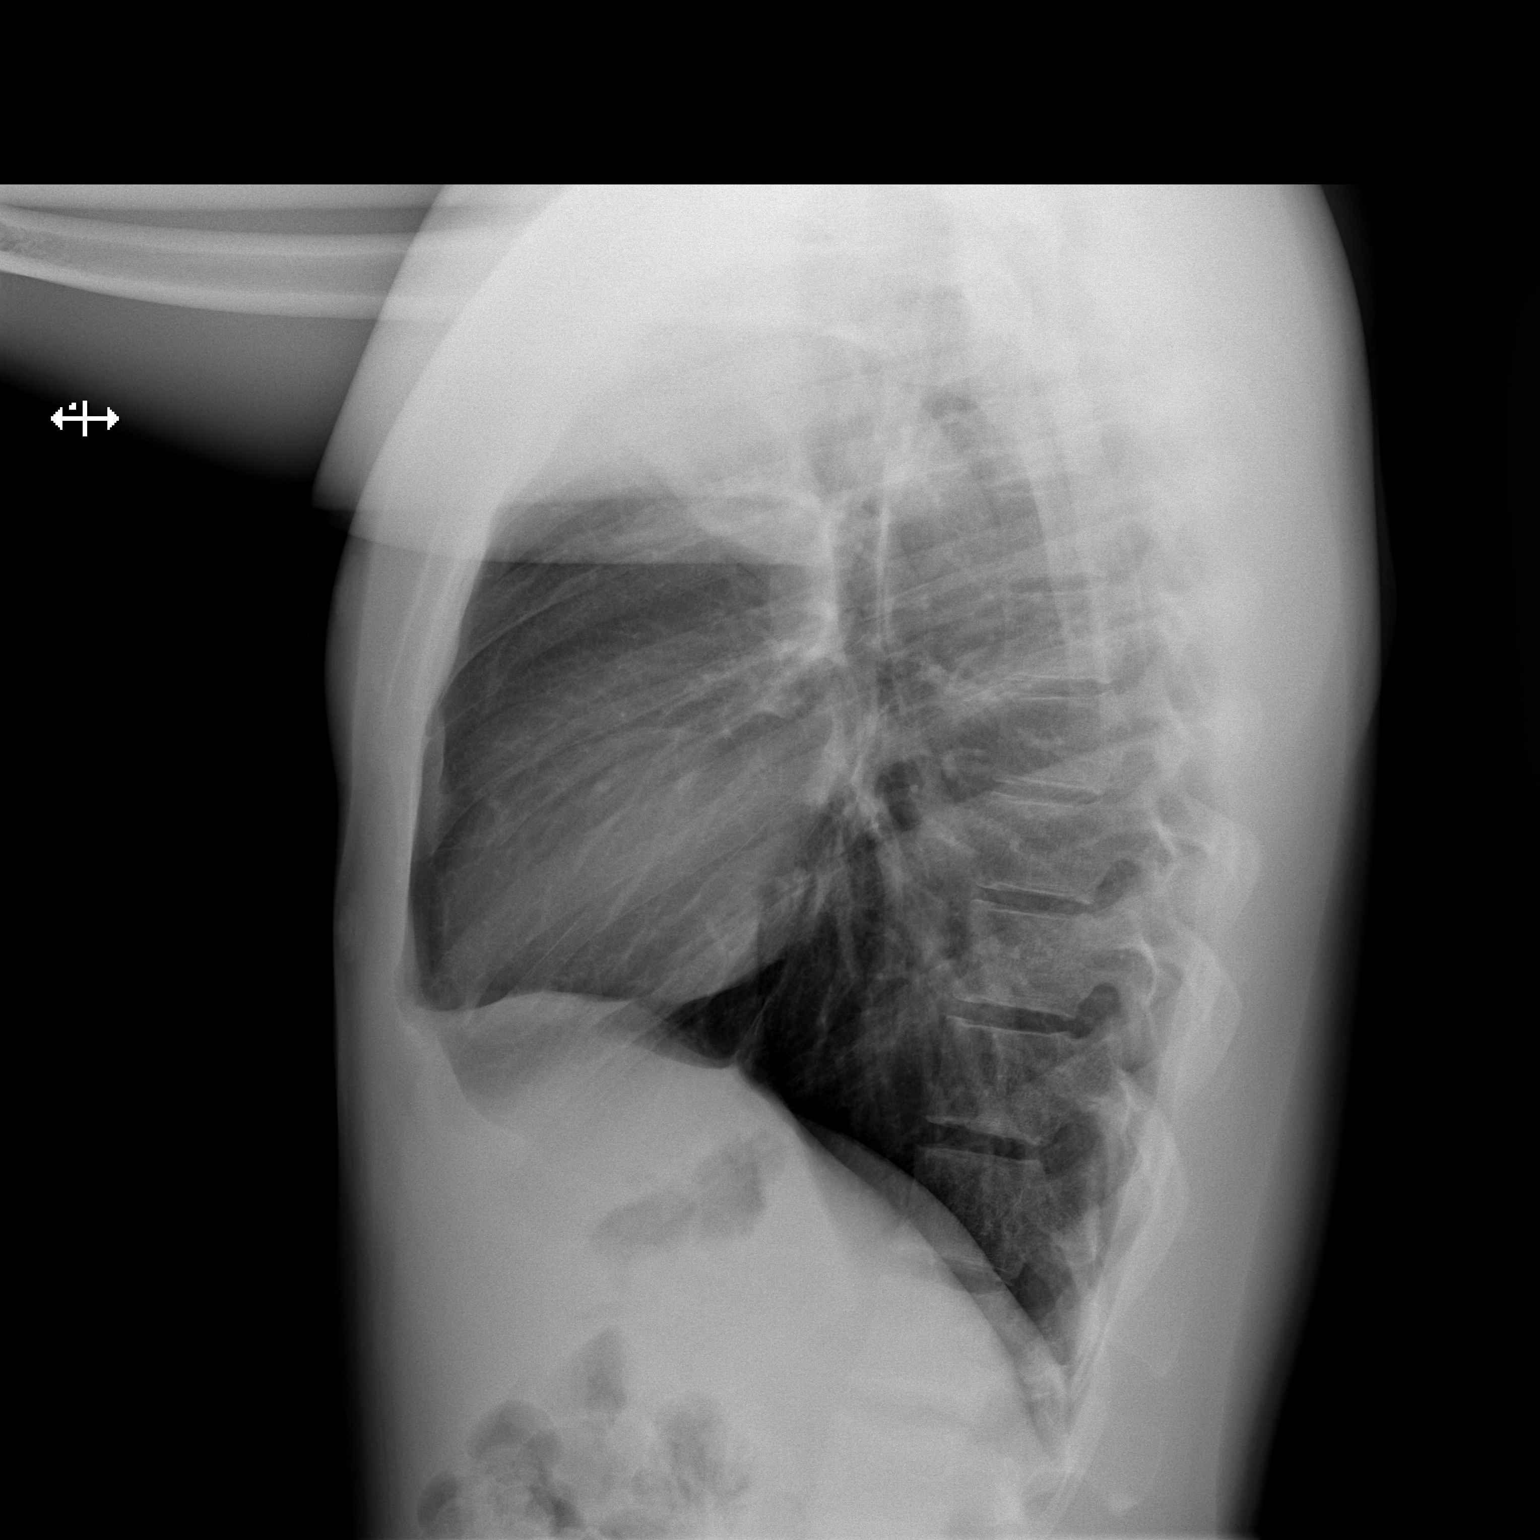

[2 of 2 positions shown; findings below may reference images not displayed]

FINDINGS: The heart size and mediastinal contours are within normal limits.
Both lungs are clear. The visualized skeletal structures are
unremarkable.
IMPRESSION: No active cardiopulmonary disease.

## 2023-06-02 ENCOUNTER — Ambulatory Visit: Payer: BC Managed Care – PPO | Admitting: Nurse Practitioner

## 2023-06-02 NOTE — Progress Notes (Deleted)
  Shawna Clamp, DNP, AGNP-c [facility]  [address] [place], [place] [address] [phone]  ESTABLISHED PATIENT- Chronic Health and/or Follow-Up Visit  There were no vitals taken for this visit.    Michael Navarro is a 22 y.o. year old male presenting today for evaluation and management of chronic conditions.    All ROS negative with exception of what is listed above.   PHYSICAL EXAM Physical Exam   PLAN Problem List Items Addressed This Visit   None   No follow-ups on file.  Shawna Clamp, DNP, AGNP-c

## 2023-06-29 ENCOUNTER — Encounter: Payer: Self-pay | Admitting: Nurse Practitioner

## 2023-06-29 ENCOUNTER — Ambulatory Visit: Payer: BC Managed Care – PPO | Admitting: Nurse Practitioner

## 2023-06-29 VITALS — BP 122/82 | HR 66 | Wt 149.2 lb

## 2023-06-29 DIAGNOSIS — R7303 Prediabetes: Secondary | ICD-10-CM | POA: Diagnosis not present

## 2023-06-29 DIAGNOSIS — F900 Attention-deficit hyperactivity disorder, predominantly inattentive type: Secondary | ICD-10-CM

## 2023-06-29 DIAGNOSIS — E559 Vitamin D deficiency, unspecified: Secondary | ICD-10-CM

## 2023-06-29 NOTE — Progress Notes (Signed)
Shawna Clamp, DNP, AGNP-c [facility]  [address] [place], [place] [address] [phone]  ESTABLISHED PATIENT- Chronic Health and/or Follow-Up Visit  Blood pressure 122/82, pulse 66, weight 149 lb 3.2 oz (67.7 kg).    Michael Navarro is a 22 y.o. year old male presenting today for evaluation and management of chronic conditions.   History of Present Illness Michael, a 22 year old male with a history of ADHD, vitamin D deficiency, and a recent diagnosis of prediabetes, presents for a follow-up visit. He is currently not on any medications.  He denies any current symptoms related to his prediabetes, such as polyuria, polydipsia, or blurred vision.   He has a family history of diabetes on his mother's side. His diet is generally healthy, with a focus on clean eating, including fruits and vegetables, and he primarily drinks water. He admits to occasionally skipping meals but tries to maintain a consistent intake due to his active lifestyle and desire to gain weight.   He reports a past use of a nitroglycerin patch for a hip injury related to his participation in football, which he continues to play actively.   He has no concerns about sexually transmitted diseases. He has no new symptoms or health concerns at this time.  All ROS negative with exception of what is listed above.   PHYSICAL EXAM Physical Exam Vitals and nursing note reviewed.  Constitutional:      Appearance: Normal appearance.  HENT:     Head: Normocephalic.  Eyes:     Pupils: Pupils are equal, round, and reactive to light.  Cardiovascular:     Rate and Rhythm: Normal rate and regular rhythm.     Pulses: Normal pulses.     Heart sounds: Normal heart sounds.  Pulmonary:     Effort: Pulmonary effort is normal.     Breath sounds: Normal breath sounds.  Musculoskeletal:        General: Normal range of motion.     Cervical back: Normal range of motion.  Skin:    General: Skin is warm.   Neurological:     General: No focal deficit present.     Mental Status: He is alert and oriented to person, place, and time.  Psychiatric:        Mood and Affect: Mood normal.        Behavior: Behavior normal.      PLAN Problem List Items Addressed This Visit     Attention deficit hyperactivity disorder, predominantly inattentive type   ADHD diagnosed in childhood. Not currently on medication and reports doing well without it. Advised to reach out if experiencing difficulty focusing or completing classwork. - Monitor for future symptoms or difficulties       Prediabetes - Primary   Blood sugars were slightly elevated at the last visit. No symptoms of polyuria, polydipsia, or blurred vision. No numbness or tingling in feet. Family history of diabetes on maternal side. Very active and maintains a healthy diet, avoiding sugary drinks and focusing on water, fruits, and vegetables. No current medications for diabetes. Discussed Delaney Schnick intervention and monitoring to prevent complications. Emphasized diet and exercise in managing blood sugar levels. Advised on alcohol's impact on blood sugar levels. - Recheck A1c - Check kidney and liver function - Provide educational materials on prediabetes - Encourage continued healthy diet and regular physical activity       Relevant Orders   Hemoglobin A1c (Completed)   CBC with Differential/Platelet (Completed)   Comprehensive metabolic panel (Completed)  Vitamin D deficiency   Vitamin D deficiency. No current medications for this condition. - Monitor for symptoms and consider supplementation if needed       Return in about 6 months (around 12/28/2023) for CPE.  Shawna Clamp, DNP, AGNP-c

## 2023-06-29 NOTE — Patient Instructions (Signed)
Prediabetes Eating Plan Prediabetes is a condition that causes blood sugar (glucose) levels to be higher than normal. This increases the risk for developing type 2 diabetes (type 2 diabetes mellitus). Working with a health care provider or nutrition specialist (dietitian) to make diet and lifestyle changes can help prevent the onset of diabetes. These changes may help you: Control your blood glucose levels. Improve your cholesterol levels. Manage your blood pressure. What are tips for following this plan? Reading food labels Read food labels to check the amount of fat, salt (sodium), and sugar in prepackaged foods. Avoid foods that have: Saturated fats. Trans fats. Added sugars. Avoid foods that have more than 300 milligrams (mg) of sodium per serving. Limit your sodium intake to less than 2,300 mg each day. Shopping Avoid buying pre-made and processed foods. Avoid buying drinks with added sugar. Cooking Cook with olive oil. Do not use butter, lard, or ghee. Bake, broil, grill, steam, or boil foods. Avoid frying. Meal planning  Work with your dietitian to create an eating plan that is right for you. This may include tracking how many calories you take in each day. Use a food diary, notebook, or mobile application to track what you eat at each meal. Consider following a Mediterranean diet. This includes: Eating several servings of fresh fruits and vegetables each day. Eating fish at least twice a week. Eating one serving each day of whole grains, beans, nuts, and seeds. Using olive oil instead of other fats. Limiting alcohol. Limiting red meat. Using nonfat or low-fat dairy products. Consider following a plant-based diet. This includes dietary choices that focus on eating mostly vegetables and fruit, grains, beans, nuts, and seeds. If you have high blood pressure, you may need to limit your sodium intake or follow a diet such as the DASH (Dietary Approaches to Stop Hypertension) eating  plan. The DASH diet aims to lower high blood pressure. Lifestyle Set weight loss goals with help from your health care team. It is recommended that most people with prediabetes lose 7% of their body weight. Exercise for at least 30 minutes 5 or more days a week. Attend a support group or seek support from a mental health counselor. Take over-the-counter and prescription medicines only as told by your health care provider. What foods are recommended? Fruits Berries. Bananas. Apples. Oranges. Grapes. Papaya. Mango. Pomegranate. Kiwi. Grapefruit. Cherries. Vegetables Lettuce. Spinach. Peas. Beets. Cauliflower. Cabbage. Broccoli. Carrots. Tomatoes. Squash. Eggplant. Herbs. Peppers. Onions. Cucumbers. Brussels sprouts. Grains Whole grains, such as whole-wheat or whole-grain breads, crackers, cereals, and pasta. Unsweetened oatmeal. Bulgur. Barley. Quinoa. Brown rice. Corn or whole-wheat flour tortillas or taco shells. Meats and other proteins Seafood. Poultry without skin. Lean cuts of pork and beef. Tofu. Eggs. Nuts. Beans. Dairy Low-fat or fat-free dairy products, such as yogurt, cottage cheese, and cheese. Beverages Water. Tea. Coffee. Sugar-free or diet soda. Seltzer water. Low-fat or nonfat milk. Milk alternatives, such as soy or almond milk. Fats and oils Olive oil. Canola oil. Sunflower oil. Grapeseed oil. Avocado. Walnuts. Sweets and desserts Sugar-free or low-fat pudding. Sugar-free or low-fat ice cream and other frozen treats. Seasonings and condiments Herbs. Sodium-free spices. Mustard. Relish. Low-salt, low-sugar ketchup. Low-salt, low-sugar barbecue sauce. Low-fat or fat-free mayonnaise. The items listed above may not be a complete list of recommended foods and beverages. Contact a dietitian for more information. What foods are not recommended? Fruits Fruits canned with syrup. Vegetables Canned vegetables. Frozen vegetables with butter or cream sauce. Grains Refined white  flour and flour  products, such as bread, pasta, snack foods, and cereals. Meats and other proteins Fatty cuts of meat. Poultry with skin. Breaded or fried meat. Processed meats. Dairy Full-fat yogurt, cheese, or milk. Beverages Sweetened drinks, such as iced tea and soda. Fats and oils Butter. Lard. Ghee. Sweets and desserts Baked goods, such as cake, cupcakes, pastries, cookies, and cheesecake. Seasonings and condiments Spice mixes with added salt. Ketchup. Barbecue sauce. Mayonnaise. The items listed above may not be a complete list of foods and beverages that are not recommended. Contact a dietitian for more information. Where to find more information American Diabetes Association: www.diabetes.org Summary You may need to make diet and lifestyle changes to help prevent the onset of diabetes. These changes can help you control blood sugar, improve cholesterol levels, and manage blood pressure. Set weight loss goals with help from your health care team. It is recommended that most people with prediabetes lose 7% of their body weight. Consider following a Mediterranean diet. This includes eating plenty of fresh fruits and vegetables, whole grains, beans, nuts, seeds, fish, and low-fat dairy, and using olive oil instead of other fats. This information is not intended to replace advice given to you by your health care provider. Make sure you discuss any questions you have with your health care provider. Document Revised: 09/26/2019 Document Reviewed: 09/26/2019 Elsevier Patient Education  2024 [employer] Prediabetes Prediabetes is when your blood sugar (blood glucose) level is higher than normal but not high enough for you to be diagnosed with type 2 diabetes. Having prediabetes puts you at risk for developing type 2 diabetes (type 2 diabetes mellitus). With certain lifestyle changes, you may be able to prevent or delay the onset of type 2 diabetes. This is important because type 2  diabetes can lead to serious complications, such as: Heart disease. Stroke. Blindness. Kidney disease. Depression. Poor circulation in the feet and legs. In severe cases, this could lead to surgical removal of a leg (amputation). What are the causes? The exact cause of prediabetes is not known. It may result from insulin resistance. Insulin resistance develops when cells in the body do not respond properly to insulin that the body makes. This can cause excess glucose to build up in the blood. High blood glucose (hyperglycemia) can develop. What increases the risk? The following factors may make you more likely to develop this condition: You have a family member with type 2 diabetes. You are older than 45 years. You had a temporary form of diabetes during a pregnancy (gestational diabetes). You had polycystic ovary syndrome (PCOS). You are overweight or obese. You are inactive (sedentary). You have a history of heart disease, including problems with cholesterol levels, high levels of blood fats, or high blood pressure. What are the signs or symptoms? You may have no symptoms. If you do have symptoms, they may include: Increased hunger. Increased thirst. Increased urination. Vision changes, such as blurry vision. Tiredness (fatigue). How is this diagnosed? This condition can be diagnosed with blood tests. Your blood glucose may be checked with one or more of the following tests: A fasting blood glucose (FBG) test. You will not be allowed to eat (you will fast) for at least 8 hours before a blood sample is taken. An A1C blood test (hemoglobin A1C). This test provides information about blood glucose levels over the previous 2?3 months. An oral glucose tolerance test (OGTT). This test measures your blood glucose at two points in time: After fasting. This is your baseline level. Two hours  after you drink a beverage that contains glucose. You may be diagnosed with prediabetes if: Your FBG  is 100?125 mg/dL (0.8-6.5 mmol/L). Your A1C level is 5.7?6.4% (39-46 mmol/mol). Your OGTT result is 140?199 mg/dL (7.8-46 mmol/L). These blood tests may be repeated to confirm your diagnosis. How is this treated? Treatment may include dietary and lifestyle changes to help lower your blood glucose and prevent type 2 diabetes from developing. In some cases, medicine may be prescribed to help lower the risk of type 2 diabetes. Follow these instructions at home: Nutrition  Follow a healthy meal plan. This includes eating lean proteins, whole grains, legumes, fresh fruits and vegetables, low-fat dairy products, and healthy fats. Follow instructions from your health care provider about eating or drinking restrictions. Meet with a dietitian to create a healthy eating plan that is right for you. Lifestyle Do moderate-intensity exercise for at least 30 minutes a day on 5 or more days each week, or as told by your health care provider. A mix of activities may be best, such as: Brisk walking, swimming, biking, and weight lifting. Lose weight as told by your health care provider. Losing 5-7% of your body weight can reverse insulin resistance. Do not drink alcohol if: Your health care provider tells you not to drink. You are pregnant, may be pregnant, or are planning to become pregnant. If you drink alcohol: Limit how much you use to: 0-1 drink a day for women. 0-2 drinks a day for men. Be aware of how much alcohol is in your drink. In the [place] one drink equals one 12 oz bottle of beer (355 mL), one 5 oz glass of wine (148 mL), or one 1 oz glass of hard liquor (44 mL). General instructions Take over-the-counter and prescription medicines only as told by your health care provider. You may be prescribed medicines that help lower the risk of type 2 diabetes. Do not use any products that contain nicotine or tobacco, such as cigarettes, e-cigarettes, and chewing tobacco. If you need help quitting, ask your  health care provider. Keep all follow-up visits. This is important. Where to find more information American Diabetes Association: www.diabetes.org Academy of Nutrition and Dietetics: www.eatright.org American Heart Association: www.heart.org Contact a health care provider if: You have any of these symptoms: Increased hunger. Increased urination. Increased thirst. Fatigue. Vision changes, such as blurry vision. Get help right away if you: Have shortness of breath. Feel confused. Vomit or feel like you may vomit. Summary Prediabetes is when your blood sugar (blood glucose)level is higher than normal but not high enough for you to be diagnosed with type 2 diabetes. Having prediabetes puts you at risk for developing type 2 diabetes (type 2 diabetes mellitus). Make lifestyle changes such as eating a healthy diet and exercising regularly to help prevent diabetes. Lose weight as told by your health care provider. This information is not intended to replace advice given to you by your health care provider. Make sure you discuss any questions you have with your health care provider. Document Revised: 09/26/2019 Document Reviewed: 09/26/2019 Elsevier Patient Education  2024 [employer].

## 2023-06-30 LAB — COMPREHENSIVE METABOLIC PANEL
ALT: 43 [IU]/L (ref 0–44)
AST: 16 [IU]/L (ref 0–40)
Albumin: 4.6 g/dL (ref 4.3–5.2)
Alkaline Phosphatase: 74 [IU]/L (ref 51–125)
BUN/Creatinine Ratio: 12 (ref 9–20)
BUN: 13 mg/dL (ref 6–20)
Bilirubin Total: 0.6 mg/dL (ref 0.0–1.2)
CO2: 21 mmol/L (ref 20–29)
Calcium: 10.1 mg/dL (ref 8.7–10.2)
Chloride: 104 mmol/L (ref 96–106)
Creatinine, Ser: 1.06 mg/dL (ref 0.76–1.27)
Globulin, Total: 2.7 g/dL (ref 1.5–4.5)
Glucose: 85 mg/dL (ref 70–99)
Potassium: 4.8 mmol/L (ref 3.5–5.2)
Sodium: 142 mmol/L (ref 134–144)
Total Protein: 7.3 g/dL (ref 6.0–8.5)
eGFR: 103 mL/min/{1.73_m2} (ref >59)

## 2023-06-30 LAB — CBC WITH DIFFERENTIAL/PLATELET
Basophils Absolute: 0.1 10*3/uL (ref 0.0–0.2)
Basos: 1 %
EOS (ABSOLUTE): 0.3 10*3/uL (ref 0.0–0.4)
Eos: 7 %
Hematocrit: 43.7 % (ref 37.5–51.0)
Hemoglobin: 13.7 g/dL (ref 13.0–17.7)
Immature Grans (Abs): 0 10*3/uL (ref 0.0–0.1)
Immature Granulocytes: 0 %
Lymphocytes Absolute: 1.7 10*3/uL (ref 0.7–3.1)
Lymphs: 39 %
MCH: 25.5 pg — ABNORMAL LOW (ref 26.6–33.0)
MCHC: 31.4 g/dL — ABNORMAL LOW (ref 31.5–35.7)
MCV: 81 fL (ref 79–97)
Monocytes Absolute: 0.4 10*3/uL (ref 0.1–0.9)
Monocytes: 8 %
Neutrophils Absolute: 2 10*3/uL (ref 1.4–7.0)
Neutrophils: 45 %
Platelets: 281 10*3/uL (ref 150–450)
RBC: 5.38 x10E6/uL (ref 4.14–5.80)
RDW: 12.8 % (ref 11.6–15.4)
WBC: 4.5 10*3/uL (ref 3.4–10.8)

## 2023-06-30 LAB — HEMOGLOBIN A1C
Est. average glucose Bld gHb Est-mCnc: 120 mg/dL
Hgb A1c MFr Bld: 5.8 % — ABNORMAL HIGH (ref 4.8–5.6)

## 2023-07-10 NOTE — Assessment & Plan Note (Signed)
ADHD diagnosed in childhood. Not currently on medication and reports doing well without it. Advised to reach out if experiencing difficulty focusing or completing classwork. - Monitor for future symptoms or difficulties

## 2023-07-10 NOTE — Assessment & Plan Note (Signed)
Blood sugars were slightly elevated at the last visit. No symptoms of polyuria, polydipsia, or blurred vision. No numbness or tingling in feet. Family history of diabetes on maternal side. Very active and maintains a healthy diet, avoiding sugary drinks and focusing on water, fruits, and vegetables. No current medications for diabetes. Discussed Jeree Delcid intervention and monitoring to prevent complications. Emphasized diet and exercise in managing blood sugar levels. Advised on alcohol's impact on blood sugar levels. - Recheck A1c - Check kidney and liver function - Provide educational materials on prediabetes - Encourage continued healthy diet and regular physical activity

## 2023-07-10 NOTE — Assessment & Plan Note (Signed)
Vitamin D deficiency. No current medications for this condition. - Monitor for symptoms and consider supplementation if needed

## 2023-11-23 ENCOUNTER — Encounter: Payer: BC Managed Care – PPO | Admitting: Nurse Practitioner

## 2023-12-19 ENCOUNTER — Encounter: Payer: Self-pay | Admitting: Nurse Practitioner

## 2023-12-19 ENCOUNTER — Ambulatory Visit: Admitting: Nurse Practitioner

## 2023-12-19 VITALS — BP 120/72 | HR 76 | Ht 67.0 in | Wt 152.0 lb

## 2023-12-19 DIAGNOSIS — E559 Vitamin D deficiency, unspecified: Secondary | ICD-10-CM

## 2023-12-19 DIAGNOSIS — Z113 Encounter for screening for infections with a predominantly sexual mode of transmission: Secondary | ICD-10-CM | POA: Diagnosis not present

## 2023-12-19 DIAGNOSIS — Z0000 Encounter for general adult medical examination without abnormal findings: Secondary | ICD-10-CM

## 2023-12-19 DIAGNOSIS — Z6823 Body mass index (BMI) 23.0-23.9, adult: Secondary | ICD-10-CM

## 2023-12-19 DIAGNOSIS — R7303 Prediabetes: Secondary | ICD-10-CM

## 2023-12-19 LAB — LIPID PANEL

## 2023-12-19 NOTE — Progress Notes (Signed)
 BP 120/72   Pulse 76   Ht 5' 7 (1.702 m)   Wt 152 lb (68.9 kg)   SpO2 98%   BMI 23.81 kg/m    Subjective:    Patient ID: Michael Navarro, male    DOB: 02/13/2001, 23 y.o.   MRN: [id number]  HPI: History of Present Illness Michael Navarro is a 23 year old male who presents for a routine check-up and STD testing.  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 He is considering transferring to another college to continue his education and possibly play football elsewhere. He is currently majoring in pre-veterinary studies but is contemplating a change in his major.  He has no current health concerns, including no shortness of breath, chest pain, palpitations, changes in bowel or bladder habits, or difficulty swallowing. He maintains a regular exercise routine and eats primarily at the college cafeteria. He is attentive to his blood sugar levels.  He is interested in receiving STD testing during this visit. He is unsure of the date of his last tetanus shot.    Pertinent items are noted in HPI.   Most Recent Depression Screen:     12/19/2023    1:55 PM 11/22/2022    9:27 AM 11/19/2021    2:25 PM  Depression screen PHQ 2/9  Decreased Interest 0 0 0  Down, Depressed, Hopeless 0 0 0  PHQ - 2 Score 0 0 0   Most Recent Anxiety Screen:     No data to display         Most Recent Falls Screen:    12/19/2023    1:55 PM 11/22/2022    9:27 AM 03/09/2022    1:30 PM  Fall Risk   Falls in the past year? 0 0 0  Number falls in past yr: 0 0 0  Injury with Fall? 0 0 0  Risk for fall due to : No Fall Risks No Fall Risks No Fall Risks  Follow up Falls evaluation completed Falls evaluation completed Falls evaluation completed      Data saved with a previous flowsheet row definition    Past medical history, surgical history, medications, allergies, family history and social history reviewed with patient today and changes made to  appropriate areas of the chart.  Past Medical History:  Past Medical History:  Diagnosis Date   Functional cardiac murmur 11/22/2022   Medications:  Current Outpatient Medications on File Prior to Visit  Medication Sig   Ascorbic Acid (VITAMIN C) 1000 MG tablet Take 1,000 mg by mouth daily.   cholecalciferol (VITAMIN D3) 25 MCG (1000 UNIT) tablet Take 1,000 Units by mouth daily.   No current facility-administered medications on file prior to visit.   Surgical History:  History reviewed. No pertinent surgical history. Allergies:  No Known Allergies Social History:  Social History   Socioeconomic History   Marital status: Single    Spouse name: Not on file   Number of children: Not on file   Years of education: Not on file   Highest education level: Not on file  Occupational History   Not on file  Tobacco Use   Smoking status: Never    Passive exposure: Never   Smokeless tobacco: Never  Substance and Sexual Activity   Alcohol use: Never   Drug use: Never   Sexual activity: Yes    Partners: Female  Other Topics Concern   Not on file  Social History  Narrative   Consulting civil engineer at [employer], sophomore.   Plays football.   Social Drivers of Health   Financial Resource Strain: Patient Unable To Answer (12/19/2023)   Overall Financial Resource Strain (CARDIA)    Difficulty of Paying Living Expenses: Patient unable to answer  Food Insecurity: Patient Declined (12/19/2023)   Hunger Vital Sign    Worried About Running Out of Food in the Last Year: Patient declined    Ran Out of Food in the Last Year: Patient declined  Transportation Needs: No Transportation Needs (12/19/2023)   PRAPARE - Administrator, Civil Service (Medical): No    Lack of Transportation (Non-Medical): No  Physical Activity: Sufficiently Active (12/19/2023)   Exercise Vital Sign    Days of Exercise per Week: 7 days    Minutes of Exercise per Session: 150+ min  Stress: No Stress Concern  Present (12/19/2023)   [employer] of Occupational Health - Occupational Stress Questionnaire    Feeling of Stress : Not at all  Social Connections: Moderately Integrated (12/19/2023)   Social Connection and Isolation Panel    Frequency of Communication with Friends and Family: More than three times a week    Frequency of Social Gatherings with Friends and Family: Once a week    Attends Religious Services: 1 to 4 times per year    Active Member of [employer] or Organizations: Yes    Attends Engineer, structural: More than 4 times per year    Marital Status: Never married  Intimate Partner Violence: Not At Risk (12/19/2023)   Humiliation, Afraid, Rape, and Kick questionnaire    Fear of Current or Ex-Partner: No    Emotionally Abused: No    Physically Abused: No    Sexually Abused: No   Social History   Tobacco Use  Smoking Status Never   Passive exposure: Never  Smokeless Tobacco Never   Social History   Substance and Sexual Activity  Alcohol Use Never   Family History:  Family History  Problem Relation Age of Onset   Diabetes Maternal Grandmother        Objective:    BP 120/72   Pulse 76   Ht 5' 7 (1.702 m)   Wt 152 lb (68.9 kg)   SpO2 98%   BMI 23.81 kg/m   Wt Readings from Last 3 Encounters:  12/19/23 152 lb (68.9 kg)  06/29/23 149 lb 3.2 oz (67.7 kg)  01/28/23 165 lb (74.8 kg)    Physical Exam Vitals and nursing note reviewed.  Constitutional:      Appearance: Normal appearance.  HENT:     Head: Normocephalic and atraumatic.     Right Ear: Hearing, tympanic membrane, ear canal and external ear normal.     Left Ear: Hearing, tympanic membrane, ear canal and external ear normal.     Nose: Nose normal.     Right Sinus: No maxillary sinus tenderness or frontal sinus tenderness.     Left Sinus: No maxillary sinus tenderness or frontal sinus tenderness.     Mouth/Throat:     Lips: Pink.     Mouth: Mucous membranes are moist.     Pharynx:  Oropharynx is clear.   Eyes:     General: Lids are normal. Vision grossly intact.     Extraocular Movements: Extraocular movements intact.     Pupils: Pupils are equal, round, and reactive to light.     Funduscopic exam:    Right eye: No hemorrhage. Red reflex present.  Left eye: No hemorrhage. Red reflex present.    Visual Fields: Right eye visual fields normal and left eye visual fields normal.   Neck:     Thyroid: No thyromegaly.     Vascular: No carotid bruit or JVD.   Cardiovascular:     Rate and Rhythm: Normal rate and regular rhythm.     Chest Wall: PMI is not displaced.     Pulses: Normal pulses.          Dorsalis pedis pulses are 2+ on the right side and 2+ on the left side.       Posterior tibial pulses are 2+ on the right side and 2+ on the left side.     Heart sounds: Normal heart sounds. No murmur heard. Pulmonary:     Effort: Pulmonary effort is normal. No respiratory distress.     Breath sounds: Normal breath sounds.  Chest:  Breasts:    Breasts are symmetrical.  Abdominal:     General: Bowel sounds are normal. There is no distension or abdominal bruit.     Palpations: Abdomen is soft. There is no hepatomegaly, splenomegaly or mass.     Tenderness: There is no abdominal tenderness. There is no right CVA tenderness, left CVA tenderness, guarding or rebound.   Musculoskeletal:        General: Normal range of motion.     Cervical back: Full passive range of motion without pain and neck supple. No tenderness. No spinous process tenderness or muscular tenderness.     Right lower leg: No edema.     Left lower leg: No edema.  Feet:     Right foot:     Toenail Condition: Right toenails are normal.     Left foot:     Toenail Condition: Left toenails are normal.  Lymphadenopathy:     Cervical: No cervical adenopathy.     Upper Body:     Right upper body: No supraclavicular adenopathy.     Left upper body: No supraclavicular adenopathy.   Skin:    General:  Skin is warm and dry.     Capillary Refill: Capillary refill takes less than 2 seconds.     Nails: There is no clubbing.   Neurological:     General: No focal deficit present.     Mental Status: He is alert and oriented to person, place, and time.     Cranial Nerves: No cranial nerve deficit.     Sensory: Sensation is intact. No sensory deficit.     Motor: Motor function is intact. No weakness.     Coordination: Coordination is intact. Coordination normal.     Gait: Gait is intact. Gait normal.   Psychiatric:        Attention and Perception: Attention normal.        Mood and Affect: Mood normal.        Speech: Speech normal.        Behavior: Behavior normal. Behavior is cooperative.        Cognition and Memory: Cognition and memory normal.      Results for orders placed or performed in visit on 12/19/23  RPR   Collection Time: 12/19/23  2:33 PM  Result Value Ref Range   RPR Ser Ql Non Reactive Non Reactive   Interpretation: Comment   HIV Antibody (routine testing w rflx)   Collection Time: 12/19/23  2:33 PM  Result Value Ref Range   HIV Screen 4th Generation wRfx Non Reactive Non  Reactive  Hepatitis C antibody   Collection Time: 12/19/23  2:33 PM  Result Value Ref Range   Hep C Virus Ab Non Reactive Non Reactive  Hemoglobin A1c   Collection Time: 12/19/23  2:59 PM  Result Value Ref Range   Hgb A1c MFr Bld 5.9 (H) 4.8 - 5.6 %   Est. average glucose Bld gHb Est-mCnc 123 mg/dL  CBC with Differential/Platelet   Collection Time: 12/19/23  2:59 PM  Result Value Ref Range   WBC 6.6 3.4 - 10.8 x10E3/uL   RBC 5.21 4.14 - 5.80 x10E6/uL   Hemoglobin 13.8 13.0 - 17.7 g/dL   Hematocrit 84.6 96.2 - 51.0 %   MCV 83 79 - 97 fL   MCH 26.5 (L) 26.6 - 33.0 pg   MCHC 31.9 31.5 - 35.7 g/dL   RDW 95.2 84.1 - 32.4 %   Platelets 270 150 - 450 x10E3/uL   Neutrophils 55 Not Estab. %   Lymphs 31 Not Estab. %   Monocytes 9 Not Estab. %   Eos 4 Not Estab. %   Basos 1 Not Estab. %    Neutrophils Absolute 3.7 1.4 - 7.0 x10E3/uL   Lymphocytes Absolute 2.0 0.7 - 3.1 x10E3/uL   Monocytes Absolute 0.6 0.1 - 0.9 x10E3/uL   EOS (ABSOLUTE) 0.2 0.0 - 0.4 x10E3/uL   Basophils Absolute 0.1 0.0 - 0.2 x10E3/uL   Immature Granulocytes 0 Not Estab. %   Immature Grans (Abs) 0.0 0.0 - 0.1 x10E3/uL  Comprehensive metabolic panel with GFR   Collection Time: 12/19/23  2:59 PM  Result Value Ref Range   Glucose 75 70 - 99 mg/dL   BUN 17 6 - 20 mg/dL   Creatinine, Ser 4.01 0.76 - 1.27 mg/dL   eGFR 027 >25 DG/UYQ/0.34   BUN/Creatinine Ratio 16 9 - 20   Sodium 138 134 - 144 mmol/L   Potassium 4.1 3.5 - 5.2 mmol/L   Chloride 102 96 - 106 mmol/L   CO2 20 20 - 29 mmol/L   Calcium 10.0 8.7 - 10.2 mg/dL   Total Protein 7.4 6.0 - 8.5 g/dL   Albumin 4.7 4.3 - 5.2 g/dL   Globulin, Total 2.7 1.5 - 4.5 g/dL   Bilirubin Total 0.4 0.0 - 1.2 mg/dL   Alkaline Phosphatase 73 44 - 121 IU/L   AST 43 (H) 0 - 40 IU/L   ALT 36 0 - 44 IU/L  VITAMIN D  25 Hydroxy (Vit-D Deficiency, Fractures)   Collection Time: 12/19/23  2:59 PM  Result Value Ref Range   Vit D, 25-Hydroxy 42.2 30.0 - 100.0 ng/mL  Lipid panel   Collection Time: 12/19/23  2:59 PM  Result Value Ref Range   Cholesterol, Total 169 100 - 199 mg/dL   Triglycerides 39 0 - 149 mg/dL   HDL 73 >74 mg/dL   VLDL Cholesterol Cal 9 5 - 40 mg/dL   LDL Chol Calc (NIH) 87 0 - 99 mg/dL   Chol/HDL Ratio 2.3 0.0 - 5.0 ratio  Ct, Ng, Mycoplasmas NAA, Urine   Collection Time: 12/19/23  3:01 PM  Result Value Ref Range   Mycoplasma genitalium NAA Negative Negative   Mycoplasma hominis NAA Negative Negative   Ureaplasma spp NAA Negative Negative   Chlamydia trachomatis, NAA Negative Negative   Neisseria gonorrhoeae, NAA Negative Negative      Assessment & Plan:   Problem List Items Addressed This Visit     Prediabetes   Previous diagnosis. Patient is very active with college football  and not overweight. I strongly suspect this is a genetic  influence. I have encouraged him to continue his current diet and exercise regimen. Will continue to monitor.       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 He may be due for a tetanus booster and is interested in STD testing. - Check tetanus vaccination status and administer booster if due - Perform STD testing - Encourage continued healthy diet and exercise routine      Relevant Orders   Hemoglobin A1c (Completed)   CBC with Differential/Platelet (Completed)   Comprehensive metabolic panel with GFR (Completed)   VITAMIN D  25 Hydroxy (Vit-D Deficiency, Fractures) (Completed)   Lipid panel (Completed)   Vitamin D  deficiency   Relevant Orders   VITAMIN D  25 Hydroxy (Vit-D Deficiency, Fractures) (Completed)   Body mass index (BMI) of 23.0 to 23.9 in adult   Other Visit Diagnoses       Routine screening for STI (sexually transmitted infection)       Relevant Orders   Ct, Ng, Mycoplasmas NAA, Urine (Completed)   RPR (Completed)   HIV Antibody (routine testing w rflx) (Completed)   Hepatitis C antibody (Completed)        Follow up plan: NEXT PREVENTATIVE PHYSICAL DUE IN 1 YEAR. Return in about 6 months (around 06/19/2024) for Med Management 30.  LABORATORY TESTING:  Health maintenance labs ordered today, if applicable.    PATIENT COUNSELING:   For all adult patients, I recommend A well balanced diet low in saturated fats, cholesterol, and moderation in carbohydrates.  This can be as simple as monitoring portion sizes and cutting back on sugary beverages such as soda and juice to start with.    Daily water consumption of at least 64  ounces.  Physical activity at least 180 minutes per week, if just starting out.  This can be as simple as taking the stairs instead of the elevator and walking 2-3 laps around the office  purposefully every day.   STD protection, partner selection, and regular testing if high risk.  Limited consumption of alcoholic beverages if alcohol is consumed. For men, I recommend no more than 14 alcoholic beverages per week, spread out throughout the week (max 2 per day). Avoid binge drinking or consuming large quantities of alcohol in one setting.  Please let me know if you feel you may need help with reduction or quitting alcohol consumption.   Avoidance of nicotine, if used. Please let me know if you feel you may need help with reduction or quitting nicotine use.   Daily mental health attention. This can be in the form of 5 minute daily meditation, prayer, journaling, yoga, reflection, etc.  Purposeful attention to your emotions and mental state can significantly improve your overall wellbeing and Health.  Please know that I am here to help you with all of your health care goals and am happy to work with you to find a solution that works best for you.  The greatest advice I have received with any changes in life are to take it one step at a time, that even means if all you can focus on is the next 60 seconds,  then do that and celebrate your victories.  With any changes in life, you will have set backs, and that is OK. The important thing to remember is, if you have a set back, it is not a failure, it is an opportunity to try again!  Health Maintenance Recommendations Screening Testing Mammogram Every 1 -2 years based on history and risk factors Starting at age 34 Pap Smear Ages 21-39 every 3 years Ages 61-65 every 5 years with HPV testing More frequent testing may be required based on results and history Colon Cancer Screening Every 1-10 years based on test performed, risk factors, and  history Starting at age 81 Bone Density Screening Every 2-10 years based on history Starting at age 50 for women Recommendations for men differ based on medication usage, history, and risk factors AAA Screening One time ultrasound Men 69-104 years old who have every smoked Lung Cancer Screening Low Dose Lung CT every 12 months Age 61-80 years with a 30 pack-year smoking history who still smoke or who have quit within the last 15 years   Screening Labs Routine  Labs: Complete Blood Count (CBC), Complete Metabolic Panel (CMP), Cholesterol (Lipid Panel) Every 6-12 months based on history and medications May be recommended more frequently based on current conditions or previous results Hemoglobin A1c Lab Every 3-12 months based on history and previous results Starting at age 17 or earlier with diagnosis of diabetes, high cholesterol, BMI >26, and/or risk factors Frequent monitoring for patients with diabetes to ensure blood sugar control Thyroid Panel (TSH) Every 6 months based on history, symptoms, and risk factors May be repeated more often if on medication HIV One time testing for all patients 70 and older May be repeated more frequently for patients with increased risk factors or exposure Hepatitis C One time testing for all patients 31 and older May be repeated more frequently for patients with increased risk factors or exposure Gonorrhea, Chlamydia Every 12 months for all sexually active persons 13-24 years Additional monitoring may be recommended for those who are considered high risk or who have symptoms Every 12 months for any woman on birth control, regardless of sexual activity PSA Men 52-85 years old with risk factors Additional screening may be recommended from age 7-69 based on risk factors, symptoms, and history  Vaccine Recommendations Tetanus Booster All adults every 10 years Flu Vaccine All patients 6 months and older every year COVID Vaccine All patients 12  years and older Initial dosing with booster May recommend additional booster based on age and health history HPV Vaccine 2 doses all patients age 29-26 Dosing may be considered for patients over 26 Shingles Vaccine (Shingrix) 2 doses all adults 55 years and older Pneumonia (Pneumovax 47) All adults 65 years and older May recommend earlier dosing based on health history One year apart from Prevnar 18 Pneumonia (Prevnar 42) All adults 65 years and older Dosed 1 year after Pneumovax 23 Pneumonia (Prevnar 20) One time alternative to the two dosing of 13 and 23 For all adults with initial dose of 23, 20 is recommended 1 year later For all adults with initial dose of 13, 23 is still recommended as second option 1 year later  Additional Screening, Testing, and Vaccinations may be recommended on an individualized basis based on family history, health history, risk factors, and/or exposure.

## 2023-12-19 NOTE — Patient Instructions (Signed)
 You look fabulous today!! We will get your lab work completed and I will let you know if there are any concerns.    Prediabetes: What to Know Prediabetes is when your blood sugar, also called glucose, is at a higher level than normal but not high enough for you to be diagnosed with type 2 diabetes (type 2 diabetes mellitus). Having prediabetes puts you at risk for getting type 2 diabetes. By making some healthy changes, you may be able to prevent or delay getting type 2 diabetes. This is important because type 2 diabetes can lead to serious problems. Some of these include: Heart disease. Stroke. Blindness. Kidney disease. Depression. Poor blood flow in the feet and legs. In very bad cases, this could lead to having a leg removed by surgery (amputation). What are the causes? The exact cause of prediabetes isn't known. It may result from insulin resistance. Insulin resistance happens when cells in the body don't respond properly to insulin that the body makes. This can cause too much sugar to build up in the blood. High blood sugar, also called hyperglycemia, can develop. What increases the risk? Having a family member with type 2 diabetes. Being older than 21 years of age. Having had a temporary form of diabetes during a pregnancy. This is called gestational diabetes. Having had polycystic ovary syndrome (PCOS). Being overweight or obese. Being inactive and not getting much exercise. Having a history of heart disease. This may include problems with cholesterol levels, high levels of blood fats, or high blood pressure. What are the signs or symptoms? You may have no symptoms. If you do have symptoms, they may include: Increased hunger. Increased thirst. Needing to pee more often. Changes in how you see, like blurry vision. Feeling tired. How is this diagnosed? Prediabetes can be diagnosed with blood tests that check your blood sugar. One or more of these tests may be done: A fasting  blood glucose (FBG) test. You won't be allowed to eat (you will fast) for at least 8 hours before a blood sample is taken. An A1C blood test, also called a hemoglobin A1C test. This test shows information about blood sugar levels over the past 2?3 months. An oral glucose tolerance test (OGTT). This test measures your blood sugar at two points in time: After you haven't eaten for a while. This is your baseline level. Two hours after you drink a beverage that has sugar in it. You may be diagnosed with prediabetes if: Your FBG is 100?125 mg/dL (1.6-1.0 mmol/L). Your A1C level is 5.7?6.4% (39-46 mmol/mol). Your OGTT result is 140?199 mg/dL (9.6-04 mmol/L). These blood tests may need to be done again to be sure of the diagnosis. How is this treated? Treatment may include making changes to your diet and lifestyle. These changes can help lower your blood sugar and keep you from getting type 2 diabetes. In some cases, medicine may be given to help lower your risk. Follow these instructions at home: Eating and drinking  Eat and drink as told. Follow a healthy meal plan. This includes eating lean proteins, whole grains, legumes, fresh fruits and vegetables, low-fat dairy products, and healthy fats. Meet with an expert in healthy eating called a dietitian. This person can help create a healthy eating plan that's right for you. Lifestyle Do moderate-intensity exercise. Do this for at least 30 minutes a day on 5 or more days each week, or as told by your health care provider. A mix of activities may be best. Good choices  include brisk walking, swimming, biking, and weight lifting. Try to lose weight if your provider says it's OK. Losing 5-7% of your body weight can help reverse insulin resistance. Do not drink alcohol if: Your provider tells you not to drink. You're pregnant, may be pregnant, or plan to become pregnant. If you drink alcohol: Limit how much you have to: 0-1 drink a day if you're  male. 0-2 drinks a day if you're male. Know how much alcohol is in your drink. In the [place] one drink is one 12 oz bottle of beer (355 mL), one 5 oz glass of wine (148 mL), or one 1 oz glass of hard liquor (44 mL). General instructions Take medicines only as told. You may be given medicines that help lower the risk of type 2 diabetes. Do not smoke, vape, or use nicotine or tobacco. Where to find more information American Diabetes Association: diabetes.org/about-diabetes/prediabetes Academy of Nutrition and Dietetics: eatright.org American Heart Association: Go to ThisJobs.cz. Click the search icon. Type "prediabetes" in the search box. Contact a health care provider if: You have any of these symptoms: Increased hunger. Peeing more often than usual. Increased thirst. Feeling tired. Changes in how you see, like blurry vision. Feeling like you may throw up. Throwing up. Get help right away if: You have shortness of breath. You feel confused. This information is not intended to replace advice given to you by your health care provider. Make sure you discuss any questions you have with your health care provider. Document Revised: 01/29/2023 Document Reviewed: 01/29/2023 Elsevier Patient Education  2024 [employer].

## 2023-12-20 ENCOUNTER — Ambulatory Visit: Payer: Self-pay | Admitting: Nurse Practitioner

## 2023-12-20 DIAGNOSIS — R7989 Other specified abnormal findings of blood chemistry: Secondary | ICD-10-CM

## 2023-12-20 LAB — HEMOGLOBIN A1C
Est. average glucose Bld gHb Est-mCnc: 123 mg/dL
Hgb A1c MFr Bld: 5.9 % — ABNORMAL HIGH (ref 4.8–5.6)

## 2023-12-20 LAB — CBC WITH DIFFERENTIAL/PLATELET
Basophils Absolute: 0.1 10*3/uL (ref 0.0–0.2)
Basos: 1 %
EOS (ABSOLUTE): 0.2 10*3/uL (ref 0.0–0.4)
Eos: 4 %
Hematocrit: 43.3 % (ref 37.5–51.0)
Hemoglobin: 13.8 g/dL (ref 13.0–17.7)
Immature Grans (Abs): 0 10*3/uL (ref 0.0–0.1)
Immature Granulocytes: 0 %
Lymphocytes Absolute: 2 10*3/uL (ref 0.7–3.1)
Lymphs: 31 %
MCH: 26.5 pg — ABNORMAL LOW (ref 26.6–33.0)
MCHC: 31.9 g/dL (ref 31.5–35.7)
MCV: 83 fL (ref 79–97)
Monocytes Absolute: 0.6 10*3/uL (ref 0.1–0.9)
Monocytes: 9 %
Neutrophils Absolute: 3.7 10*3/uL (ref 1.4–7.0)
Neutrophils: 55 %
Platelets: 270 10*3/uL (ref 150–450)
RBC: 5.21 x10E6/uL (ref 4.14–5.80)
RDW: 13.2 % (ref 11.6–15.4)
WBC: 6.6 10*3/uL (ref 3.4–10.8)

## 2023-12-20 LAB — COMPREHENSIVE METABOLIC PANEL WITH GFR
ALT: 36 IU/L (ref 0–44)
AST: 43 IU/L — ABNORMAL HIGH (ref 0–40)
Albumin: 4.7 g/dL (ref 4.3–5.2)
Alkaline Phosphatase: 73 IU/L (ref 44–121)
BUN/Creatinine Ratio: 16 (ref 9–20)
BUN: 17 mg/dL (ref 6–20)
Bilirubin Total: 0.4 mg/dL (ref 0.0–1.2)
CO2: 20 mmol/L (ref 20–29)
Calcium: 10 mg/dL (ref 8.7–10.2)
Chloride: 102 mmol/L (ref 96–106)
Creatinine, Ser: 1.06 mg/dL (ref 0.76–1.27)
Globulin, Total: 2.7 g/dL (ref 1.5–4.5)
Glucose: 75 mg/dL (ref 70–99)
Potassium: 4.1 mmol/L (ref 3.5–5.2)
Sodium: 138 mmol/L (ref 134–144)
Total Protein: 7.4 g/dL (ref 6.0–8.5)
eGFR: 102 mL/min/{1.73_m2} (ref >59)

## 2023-12-20 LAB — LIPID PANEL
Cholesterol, Total: 169 mg/dL (ref 100–199)
HDL: 73 mg/dL (ref >39)
LDL CALC COMMENT:: 2.3 ratio (ref 0.0–5.0)
LDL Chol Calc (NIH): 87 mg/dL (ref 0–99)
Triglycerides: 39 mg/dL (ref 0–149)
VLDL Cholesterol Cal: 9 mg/dL (ref 5–40)

## 2023-12-20 LAB — VITAMIN D 25 HYDROXY (VIT D DEFICIENCY, FRACTURES): Vit D, 25-Hydroxy: 42.2 ng/mL (ref 30.0–100.0)

## 2023-12-21 LAB — HIV ANTIBODY (ROUTINE TESTING W REFLEX): HIV Screen 4th Generation wRfx: NONREACTIVE

## 2023-12-21 LAB — RPR: RPR Ser Ql: NONREACTIVE

## 2023-12-21 LAB — CT, NG, MYCOPLASMAS NAA, URINE
Chlamydia trachomatis, NAA: NEGATIVE
Mycoplasma genitalium NAA: NEGATIVE
Mycoplasma hominis NAA: NEGATIVE
Neisseria gonorrhoeae, NAA: NEGATIVE
Ureaplasma spp NAA: NEGATIVE

## 2023-12-21 LAB — HEPATITIS C ANTIBODY: Hep C Virus Ab: NONREACTIVE

## 2023-12-27 NOTE — Assessment & Plan Note (Signed)
 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 He may be due for a tetanus booster and is interested in STD testing. - Check tetanus vaccination status and administer booster if due - Perform STD testing - Encourage continued healthy diet and exercise routine

## 2023-12-27 NOTE — Assessment & Plan Note (Signed)
 Previous diagnosis. Patient is very active with college football and not overweight. I strongly suspect this is a genetic influence. I have encouraged him to continue his current diet and exercise regimen. Will continue to monitor.

## 2023-12-28 ENCOUNTER — Encounter: Payer: BC Managed Care – PPO | Admitting: Nurse Practitioner

## 2024-01-18 ENCOUNTER — Other Ambulatory Visit

## 2024-02-02 ENCOUNTER — Other Ambulatory Visit

## 2024-02-02 DIAGNOSIS — R7989 Other specified abnormal findings of blood chemistry: Secondary | ICD-10-CM | POA: Diagnosis not present

## 2024-02-03 LAB — COMPREHENSIVE METABOLIC PANEL WITH GFR
ALT: 24 IU/L (ref 0–44)
AST: 14 IU/L (ref 0–40)
Albumin: 4.6 g/dL (ref 4.3–5.2)
Alkaline Phosphatase: 70 IU/L (ref 44–121)
BUN/Creatinine Ratio: 14 (ref 9–20)
BUN: 15 mg/dL (ref 6–20)
Bilirubin Total: 0.6 mg/dL (ref 0.0–1.2)
CO2: 21 mmol/L (ref 20–29)
Calcium: 9.6 mg/dL (ref 8.7–10.2)
Chloride: 103 mmol/L (ref 96–106)
Creatinine, Ser: 1.06 mg/dL (ref 0.76–1.27)
Globulin, Total: 2.3 g/dL (ref 1.5–4.5)
Glucose: 80 mg/dL (ref 70–99)
Potassium: 4.3 mmol/L (ref 3.5–5.2)
Sodium: 139 mmol/L (ref 134–144)
Total Protein: 6.9 g/dL (ref 6.0–8.5)
eGFR: 102 mL/min/1.73 (ref >59)

## 2024-02-05 ENCOUNTER — Ambulatory Visit: Payer: Self-pay | Admitting: Nurse Practitioner

## 2024-02-07 DIAGNOSIS — R6883 Chills (without fever): Secondary | ICD-10-CM | POA: Diagnosis not present

## 2024-02-07 DIAGNOSIS — R051 Acute cough: Secondary | ICD-10-CM | POA: Diagnosis not present

## 2024-02-07 DIAGNOSIS — R0981 Nasal congestion: Secondary | ICD-10-CM | POA: Diagnosis not present

## 2024-07-01 ENCOUNTER — Encounter: Payer: Self-pay | Admitting: Nurse Practitioner

## 2024-07-01 DIAGNOSIS — M2141 Flat foot [pes planus] (acquired), right foot: Secondary | ICD-10-CM | POA: Insufficient documentation

## 2024-07-01 NOTE — Progress Notes (Signed)
 Patient not seen.

## 2024-07-01 NOTE — Assessment & Plan Note (Deleted)
 SABRA

## 2024-08-26 ENCOUNTER — Ambulatory Visit: Admitting: Nurse Practitioner

## 2024-12-19 ENCOUNTER — Encounter: Payer: Self-pay | Admitting: Nurse Practitioner
